# Patient Record
Sex: Female | Born: 1960
Health system: Southern US, Community
[De-identification: ages and names within clinical notes are randomized; demographics above are authoritative.]

## PROBLEM LIST (undated history)

## (undated) DIAGNOSIS — G44229 Chronic tension-type headache, not intractable: Secondary | ICD-10-CM

## (undated) DIAGNOSIS — F410 Panic disorder [episodic paroxysmal anxiety] without agoraphobia: Secondary | ICD-10-CM

## (undated) DIAGNOSIS — K219 Gastro-esophageal reflux disease without esophagitis: Secondary | ICD-10-CM

## (undated) DIAGNOSIS — D126 Benign neoplasm of colon, unspecified: Secondary | ICD-10-CM

## (undated) DIAGNOSIS — D219 Benign neoplasm of connective and other soft tissue, unspecified: Secondary | ICD-10-CM

## (undated) DIAGNOSIS — IMO0001 Reserved for inherently not codable concepts without codable children: Secondary | ICD-10-CM

## (undated) HISTORY — DX: Reserved for inherently not codable concepts without codable children: IMO0001

## (undated) HISTORY — DX: Chronic tension-type headache, not intractable: G44.229

## (undated) HISTORY — PX: TONSILLECTOMY: SUR1361

## (undated) HISTORY — PX: MYOMECTOMY: SHX85

## (undated) HISTORY — DX: Benign neoplasm of colon, unspecified: D12.6

## (undated) HISTORY — DX: Benign neoplasm of connective and other soft tissue, unspecified: D21.9

## (undated) HISTORY — DX: Panic disorder (episodic paroxysmal anxiety): F41.0

---

## 1998-05-24 ENCOUNTER — Ambulatory Visit (HOSPITAL_COMMUNITY): Admission: RE | Admit: 1998-05-24 | Discharge: 1998-05-24 | Payer: Self-pay | Admitting: *Deleted

## 1999-02-17 ENCOUNTER — Other Ambulatory Visit: Admission: RE | Admit: 1999-02-17 | Discharge: 1999-02-17 | Payer: Self-pay | Admitting: *Deleted

## 2000-08-13 ENCOUNTER — Other Ambulatory Visit: Admission: RE | Admit: 2000-08-13 | Discharge: 2000-08-13 | Payer: Self-pay | Admitting: *Deleted

## 2000-09-18 ENCOUNTER — Encounter: Payer: Self-pay | Admitting: *Deleted

## 2000-09-18 ENCOUNTER — Ambulatory Visit (HOSPITAL_COMMUNITY): Admission: RE | Admit: 2000-09-18 | Discharge: 2000-09-18 | Payer: Self-pay | Admitting: *Deleted

## 2001-11-29 ENCOUNTER — Other Ambulatory Visit: Admission: RE | Admit: 2001-11-29 | Discharge: 2001-11-29 | Payer: Self-pay | Admitting: *Deleted

## 2002-11-18 ENCOUNTER — Encounter: Payer: Self-pay | Admitting: Obstetrics and Gynecology

## 2002-11-18 ENCOUNTER — Ambulatory Visit (HOSPITAL_COMMUNITY): Admission: RE | Admit: 2002-11-18 | Discharge: 2002-11-18 | Payer: Self-pay | Admitting: Obstetrics and Gynecology

## 2002-12-02 ENCOUNTER — Other Ambulatory Visit: Admission: RE | Admit: 2002-12-02 | Discharge: 2002-12-02 | Payer: Self-pay | Admitting: Obstetrics and Gynecology

## 2004-04-29 ENCOUNTER — Ambulatory Visit (HOSPITAL_COMMUNITY): Admission: RE | Admit: 2004-04-29 | Discharge: 2004-04-29 | Payer: Self-pay | Admitting: Family Medicine

## 2004-05-24 ENCOUNTER — Other Ambulatory Visit: Admission: RE | Admit: 2004-05-24 | Discharge: 2004-05-24 | Payer: Self-pay | Admitting: Family Medicine

## 2005-12-05 ENCOUNTER — Ambulatory Visit (HOSPITAL_COMMUNITY): Admission: RE | Admit: 2005-12-05 | Discharge: 2005-12-05 | Payer: Self-pay | Admitting: Obstetrics and Gynecology

## 2007-10-03 ENCOUNTER — Ambulatory Visit: Payer: Self-pay | Admitting: Family Medicine

## 2008-10-12 ENCOUNTER — Ambulatory Visit: Payer: Self-pay | Admitting: Family Medicine

## 2008-10-28 ENCOUNTER — Ambulatory Visit (HOSPITAL_COMMUNITY): Admission: RE | Admit: 2008-10-28 | Discharge: 2008-10-28 | Payer: Self-pay | Admitting: Obstetrics and Gynecology

## 2008-11-10 ENCOUNTER — Ambulatory Visit: Payer: Self-pay | Admitting: Family Medicine

## 2008-11-11 ENCOUNTER — Ambulatory Visit: Payer: Self-pay | Admitting: Family Medicine

## 2008-12-22 ENCOUNTER — Ambulatory Visit: Payer: Self-pay | Admitting: Family Medicine

## 2009-09-27 ENCOUNTER — Ambulatory Visit: Payer: Self-pay | Admitting: Family Medicine

## 2009-10-22 ENCOUNTER — Ambulatory Visit: Payer: Self-pay | Admitting: Family Medicine

## 2009-10-29 ENCOUNTER — Ambulatory Visit: Payer: Self-pay | Admitting: Family Medicine

## 2009-11-22 ENCOUNTER — Ambulatory Visit (HOSPITAL_COMMUNITY): Admission: RE | Admit: 2009-11-22 | Discharge: 2009-11-22 | Payer: Self-pay | Admitting: Family Medicine

## 2009-12-17 ENCOUNTER — Ambulatory Visit (HOSPITAL_COMMUNITY): Admission: RE | Admit: 2009-12-17 | Discharge: 2009-12-17 | Payer: Self-pay | Admitting: Gastroenterology

## 2010-09-20 ENCOUNTER — Ambulatory Visit: Payer: Self-pay | Admitting: Family Medicine

## 2010-09-29 ENCOUNTER — Ambulatory Visit: Payer: Self-pay | Admitting: Physician Assistant

## 2011-05-15 ENCOUNTER — Encounter: Payer: Self-pay | Admitting: Family Medicine

## 2011-09-25 ENCOUNTER — Encounter: Payer: Self-pay | Admitting: Medical

## 2011-09-25 ENCOUNTER — Ambulatory Visit (INDEPENDENT_AMBULATORY_CARE_PROVIDER_SITE_OTHER): Payer: Managed Care, Other (non HMO) | Admitting: Medical

## 2011-09-25 VITALS — BP 116/80 | HR 80 | Temp 98.1°F | Resp 16 | Wt 164.0 lb

## 2011-09-25 DIAGNOSIS — E559 Vitamin D deficiency, unspecified: Secondary | ICD-10-CM | POA: Insufficient documentation

## 2011-09-25 DIAGNOSIS — R252 Cramp and spasm: Secondary | ICD-10-CM | POA: Insufficient documentation

## 2011-09-25 DIAGNOSIS — R609 Edema, unspecified: Secondary | ICD-10-CM

## 2011-09-25 LAB — POCT URINALYSIS DIPSTICK
Glucose, UA: NEGATIVE
Leukocytes, UA: NEGATIVE
Nitrite, UA: NEGATIVE
Protein, UA: NEGATIVE
Urobilinogen, UA: NEGATIVE

## 2011-09-25 NOTE — Patient Instructions (Signed)
Edema Edema is an abnormal build-up of fluids in tissues. Because this is partly dependent on gravity (water flows to the lowest place), it is more common in the leg sand thighs (lower extremities). It is also common in the looser tissues, like around the eyes. Painless swelling of the feet and ankles is common and increases as a person ages. It may affect both legs and may include the calves or even thighs. When squeezed, the fluid may move out of the affected area and may leave a dent for a few moments. CAUSES   Prolonged standing or sitting in one place for extended periods of time. Movement helps pump tissue fluid into the veins, and absence of movement prevents this, resulting in edema.   Varicose veins. The valves in the veins do not work as well as they should. This causes fluid to leak into the tissues.   Fluid and salt overload.   Injury, burn, or surgery to the leg, ankle, or foot, may damage veins and allow fluid to leak out.   Sunburn damages vessels. Leaky vessels allow fluid to go out into the sunburned tissues.   Allergies (from insect bites or stings, medications or chemicals) cause swelling by allowing vessels to become leaky.   Protein in the blood helps keep fluid in your vessels. Low protein, as in malnutrition, allows fluid to leak out.   Hormonal changes, including pregnancy and menstruation, cause fluid retention. This fluid may leak out of vessels and cause edema.   Medications that cause fluid retention. Examples are sex hormones, blood pressure medications, steroid treatment, or anti-depressants.   Some illnesses cause edema, especially heart failure, kidney disease, or liver disease.   Surgery that cuts veins or lymph nodes, such as surgery done for the heart or for breast cancer, may result in edema.  DIAGNOSIS  Your caregiver is usually easily able to determine what is causing your swelling (edema) by simply asking what is wrong (getting a history) and examining  you (doing a physical). Sometimes x-rays, EKG (electrocardiogram or heart tracing), and blood work may be done to evaluate for underlying medical illness. TREATMENT  General treatment includes:  Leg elevation (or elevation of the affected body part).   Restriction of fluid intake.   Prevention of fluid overload.   Compression of the affected body part. Compression with elastic bandages or support stockings squeezes the tissues, preventing fluid from entering and forcing it back into the blood vessels.   Diuretics (also called water pills or fluid pills) pull fluid out of your body in the form of increased urination. These are effective in reducing the swelling, but can have side effects and must be used only under your caregiver's supervision. Diuretics are appropriate only for some types of edema.  The specific treatment can be directed at any underlying causes discovered. Heart, liver, or kidney disease should be treated appropriately. HOME CARE INSTRUCTIONS   Elevate the legs (or affected body part) above the level of the heart, while lying down.   Avoid sitting or standing still for prolonged periods of time.   Avoid putting anything directly under the knees when lying down, and do not wear constricting clothing or garters on the upper legs.   Exercising the legs causes the fluid to work back into the veins and lymphatic channels. This may help the swelling go down.   The pressure applied by elastic bandages or support stockings can help reduce ankle swelling.   A low-salt diet may help reduce fluid   retention and decrease the ankle swelling.   Take any medications exactly as prescribed.  SEEK MEDICAL CARE IF:  Your edema is not responding to recommended treatments. SEEK IMMEDIATE MEDICAL CARE IF:   You develop shortness of breath or chest pain.   You cannot breathe when you lay down; or if, while lying down, you have to get up and go to the window to get your breath.   You  are having increasing swelling without relief from treatment.   You develop a fever over 102 F (38.9 C).   You develop pain or redness in the areas that are swollen.   Tell your caregiver right away if you have gained 3 lb/1.4 kg in 1 day or 5 lb/2.3 kg in a week.  MAKE SURE YOU:   Understand these instructions.   Will watch your condition.   Will get help right away if you are not doing well or get worse.  Document Released: 10/02/2005 Document Revised: 06/14/2011 Document Reviewed: 05/20/2008 [employer] Patient Information 2012 [employer]. 

## 2011-09-25 NOTE — Progress Notes (Signed)
Subjective:   HPI  Marie Vazquez is a 50 y.o. female who presents for bilat ankle swelling x 1wk, worse on the left.  She denies any new changes in diet, lifestyle, exercise.  Not been on feet more than usual, hasn't eaten more salt, has actually cut back on salt a while ago.  She though maybe she sprained her ankles as they felt a little sore.   Swelling is improved in the morning after being supine all night.  Denies hx/o injury or trauma.  Denies CP, no SOB or dyspnea.   Denies hx/o CHF or renal disease.  She works at a sedentary job in the office, walks some, walks stairs 3 times daily , 88 stairs.  No other aggravating or relieving factors.    No other c/o.  The following portions of the patient's history were reviewed and updated as appropriate: allergies, current medications, past family history, past medical history, past social history, past surgical history and problem list.  Past Medical History  Diagnosis Date  . Chronic tension headaches   . Panic attacks   . Fibroids   . Contraception   . Vitamin D deficiency    Review of Systems Constitutional: -fever, -chills, -sweats, -unexpected -weight change,-fatigue ENT: -runny nose, -ear pain, -sore throat Cardiology:  -chest pain, -palpitations, +edema Respiratory: -cough, -shortness of breath, -wheezing Gastroenterology: -abdominal pain, -nausea, -vomiting, -diarrhea, -constipation Hematology: -bleeding or bruising problems Musculoskeletal: -arthralgias, -myalgias, -joint swelling, -back pain Ophthalmology: -vision changes Urology: -dysuria, -difficulty urinating, -hematuria, -urinary frequency, -urgency Neurology: +headache, -weakness, -tingling, -numbness     Objective:   Physical Exam  Filed Vitals:   09/25/11 1354  BP: 116/80  Pulse: 80  Temp: 98.1 F (36.7 C)  Resp: 16    General appearance: alert, no distress, WD/WN Oral cavity: MMM, no lesions Neck: supple, no lymphadenopathy, no thyromegaly, no masses Heart:  RRR, normal S1, S2, no murmurs Lungs: CTA bilaterally, no wheezes, rhonchi, or rales Abdomen: +bs, soft, non tender, non distended, no masses, no hepatomegaly, no splenomegaly Pulses: 2+ symmetric, upper and lower extremities, normal cap refill   Assessment and Plan :    Encounter Diagnoses  Name Primary?  . Edema Yes  . Cramp in limb   . Vitamin D deficiency    Edema, cramp - labs today.  Discussed possible etiologies of edema.  Likely dependent edema, but discussed other etiologies.  Advised she f/u with gynecology for yearly screening and exam.  Increase exercise, c/t low salt diet, can consider OTC compression hose.  Will call with labs.   Cramps - hydrate well, pending labs, can consider Magnesium and Potassium or can consider OTC Tonic water.  Vit D deficiency prior - recheck labs, and if still, consider OTC Vit D daily indefinitely

## 2011-09-26 LAB — CBC WITH DIFFERENTIAL/PLATELET
Basophils Absolute: 0 10*3/uL (ref 0.0–0.1)
Eosinophils Relative: 2 % (ref 0–5)
HCT: 38.6 % (ref 36.0–46.0)
Hemoglobin: 12.3 g/dL (ref 12.0–15.0)
Lymphocytes Relative: 35 % (ref 12–46)
MCV: 90.6 fL (ref 78.0–100.0)
Monocytes Absolute: 0.4 10*3/uL (ref 0.1–1.0)
Monocytes Relative: 8 % (ref 3–12)
RDW: 13.9 % (ref 11.5–15.5)
WBC: 4.7 10*3/uL (ref 4.0–10.5)

## 2011-09-26 LAB — COMPREHENSIVE METABOLIC PANEL
ALT: 13 U/L (ref 0–35)
AST: 20 U/L (ref 0–37)
Albumin: 4.3 g/dL (ref 3.5–5.2)
Alkaline Phosphatase: 70 U/L (ref 39–117)
BUN: 11 mg/dL (ref 6–23)
CO2: 24 mEq/L (ref 19–32)
Calcium: 9.1 mg/dL (ref 8.4–10.5)
Chloride: 104 mEq/L (ref 96–112)
Creat: 0.9 mg/dL (ref 0.50–1.10)
Glucose, Bld: 81 mg/dL (ref 70–99)
Potassium: 4.2 mEq/L (ref 3.5–5.3)
Sodium: 138 mEq/L (ref 135–145)
Total Bilirubin: 0.4 mg/dL (ref 0.3–1.2)
Total Protein: 7.2 g/dL (ref 6.0–8.3)

## 2011-09-26 LAB — TSH: TSH: 2 u[IU]/mL (ref 0.350–4.500)

## 2011-09-26 LAB — VITAMIN D 25 HYDROXY (VIT D DEFICIENCY, FRACTURES): Vit D, 25-Hydroxy: 14 ng/mL — ABNORMAL LOW (ref 30–89)

## 2011-10-06 ENCOUNTER — Other Ambulatory Visit (HOSPITAL_COMMUNITY): Payer: Self-pay | Admitting: Obstetrics and Gynecology

## 2011-10-06 DIAGNOSIS — Z1231 Encounter for screening mammogram for malignant neoplasm of breast: Secondary | ICD-10-CM

## 2011-11-08 ENCOUNTER — Ambulatory Visit (HOSPITAL_COMMUNITY)
Admission: RE | Admit: 2011-11-08 | Discharge: 2011-11-08 | Disposition: A | Discharge status: Home / Self Care | Payer: Managed Care, Other (non HMO) | Source: Ambulatory Visit | Attending: Obstetrics and Gynecology | Admitting: Obstetrics and Gynecology

## 2011-11-08 DIAGNOSIS — Z1231 Encounter for screening mammogram for malignant neoplasm of breast: Secondary | ICD-10-CM

## 2012-11-01 ENCOUNTER — Other Ambulatory Visit: Payer: Self-pay | Admitting: Family Medicine

## 2012-11-01 DIAGNOSIS — Z1231 Encounter for screening mammogram for malignant neoplasm of breast: Secondary | ICD-10-CM

## 2012-11-15 ENCOUNTER — Ambulatory Visit (HOSPITAL_COMMUNITY)
Admission: RE | Admit: 2012-11-15 | Discharge: 2012-11-15 | Disposition: A | Discharge status: Home / Self Care | Payer: Managed Care, Other (non HMO) | Source: Ambulatory Visit | Attending: Family Medicine | Admitting: Family Medicine

## 2012-11-15 DIAGNOSIS — Z1231 Encounter for screening mammogram for malignant neoplasm of breast: Secondary | ICD-10-CM

## 2013-03-28 ENCOUNTER — Ambulatory Visit (INDEPENDENT_AMBULATORY_CARE_PROVIDER_SITE_OTHER): Payer: Managed Care, Other (non HMO) | Admitting: Medical

## 2013-03-28 ENCOUNTER — Encounter: Payer: Self-pay | Admitting: Medical

## 2013-03-28 VITALS — BP 100/80 | HR 72 | Temp 98.2°F | Resp 16 | Wt 170.0 lb

## 2013-03-28 DIAGNOSIS — H6091 Unspecified otitis externa, right ear: Secondary | ICD-10-CM

## 2013-03-28 DIAGNOSIS — H60399 Other infective otitis externa, unspecified ear: Secondary | ICD-10-CM

## 2013-03-28 MED ORDER — NEOMYCIN-POLYMYXIN-HC 3.5-10000-1 OT SOLN: 3.0000 [drp] | Freq: Four times a day (QID) | OTIC | Status: DC

## 2013-03-28 NOTE — Progress Notes (Signed)
Subjective:  Marie Vazquez is a 52 y.o. female who presents for evaluation of right ear pain. Symptoms have been present for 2 weeks. She also notes drainage in the right ear and a plugged sensation in the right ear. She does have a history of ear infections in remote past. She does not have a history of recent swimming.  Denies cough, runny nose, fever, sore throat.    Past Medical History  Diagnosis Date  . Chronic tension headaches   . Panic attacks   . Fibroids   . Contraception   . Vitamin D deficiency    ROS as in subjective    Objective:    BP 100/80  Pulse 72  Temp(Src) 98.2 F (36.8 C) (Oral)  Resp 16  Wt 170 lb (77.111 kg) General:  alert and cooperative  Right Ear: right TM could not see and right canal inflamed and with purulent discharge  Left Ear: normal appearance  Mouth:  lips, mucosa, and tongue normal; teeth and gums normal  Neck: no adenopathy, supple, symmetrical, trachea midline and thyroid not enlarged, symmetric, no tenderness/mass/nodules        Assessment and Plan:   Encounter Diagnosis  Name Primary?  . Otitis externa, right Yes   Discussed diagnosis, symptoms, risk factors, treatment.  Begin cortisporin otic.  If not improving in 3-4 days, then return.  Return soon for physical.

## 2014-06-09 ENCOUNTER — Other Ambulatory Visit (HOSPITAL_COMMUNITY): Payer: Self-pay | Admitting: Obstetrics and Gynecology

## 2014-06-09 DIAGNOSIS — Z1231 Encounter for screening mammogram for malignant neoplasm of breast: Secondary | ICD-10-CM

## 2014-06-29 ENCOUNTER — Ambulatory Visit (HOSPITAL_COMMUNITY)
Admission: RE | Admit: 2014-06-29 | Discharge: 2014-06-29 | Disposition: A | Discharge status: Home / Self Care | Payer: BC Managed Care – PPO | Source: Ambulatory Visit | Attending: Obstetrics and Gynecology | Admitting: Obstetrics and Gynecology

## 2014-06-29 ENCOUNTER — Other Ambulatory Visit (HOSPITAL_COMMUNITY): Payer: Self-pay | Admitting: Obstetrics and Gynecology

## 2014-06-29 DIAGNOSIS — Z1231 Encounter for screening mammogram for malignant neoplasm of breast: Secondary | ICD-10-CM | POA: Diagnosis present

## 2015-11-19 ENCOUNTER — Encounter: Payer: Self-pay | Admitting: Family Medicine

## 2015-11-19 ENCOUNTER — Ambulatory Visit (INDEPENDENT_AMBULATORY_CARE_PROVIDER_SITE_OTHER): Payer: BLUE CROSS/BLUE SHIELD | Admitting: Family Medicine

## 2015-11-19 VITALS — BP 102/68 | HR 65 | Wt 173.0 lb

## 2015-11-19 DIAGNOSIS — R21 Rash and other nonspecific skin eruption: Secondary | ICD-10-CM

## 2015-11-19 LAB — CBC WITH DIFFERENTIAL/PLATELET
Basophils Absolute: 0 10*3/uL (ref 0.0–0.1)
Basophils Relative: 0 % (ref 0–1)
EOS PCT: 2 % (ref 0–5)
Eosinophils Absolute: 0.1 10*3/uL (ref 0.0–0.7)
HCT: 39.7 % (ref 36.0–46.0)
HEMOGLOBIN: 12.7 g/dL (ref 12.0–15.0)
LYMPHS PCT: 28 % (ref 12–46)
Lymphs Abs: 1.6 10*3/uL (ref 0.7–4.0)
MCH: 27.7 pg (ref 26.0–34.0)
MCHC: 32 g/dL (ref 30.0–36.0)
MCV: 86.5 fL (ref 78.0–100.0)
MPV: 11 fL (ref 8.6–12.4)
Monocytes Absolute: 0.4 10*3/uL (ref 0.1–1.0)
Monocytes Relative: 8 % (ref 3–12)
Neutro Abs: 3.5 10*3/uL (ref 1.7–7.7)
Neutrophils Relative %: 62 % (ref 43–77)
Platelets: 216 10*3/uL (ref 150–400)
RBC: 4.59 MIL/uL (ref 3.87–5.11)
RDW: 13.9 % (ref 11.5–15.5)
WBC: 5.6 10*3/uL (ref 4.0–10.5)

## 2015-11-19 LAB — COMPREHENSIVE METABOLIC PANEL
ALBUMIN: 4.3 g/dL (ref 3.6–5.1)
ALT: 10 U/L (ref 6–29)
AST: 16 U/L (ref 10–35)
Alkaline Phosphatase: 73 U/L (ref 33–130)
BUN: 16 mg/dL (ref 7–25)
CALCIUM: 9.5 mg/dL (ref 8.6–10.4)
CO2: 24 mmol/L (ref 20–31)
Chloride: 101 mmol/L (ref 98–110)
Creat: 0.92 mg/dL (ref 0.50–1.05)
Glucose, Bld: 88 mg/dL (ref 65–99)
POTASSIUM: 4 mmol/L (ref 3.5–5.3)
Sodium: 140 mmol/L (ref 135–146)
TOTAL PROTEIN: 7.7 g/dL (ref 6.1–8.1)
Total Bilirubin: 0.4 mg/dL (ref 0.2–1.2)

## 2015-11-19 NOTE — Patient Instructions (Signed)
Use Benadryl daily. Cold wash rag to finger will also help. The swelling in her finger gets worse she'll have to go to the ER

## 2015-11-19 NOTE — Progress Notes (Signed)
   Subjective:    Patient ID: Marie Vazquez, female    DOB: 1960-12-14, 55 y.o.   MRN: [id number]  HPI She noted a one-day history of swelling to the PIP joint area of the left ring finger. Apparently the swelling today has diminished precipitously since this morning. She then noted some left upper chest swelling and itching as well as a lesion on the posterior neck on the left side. She's had no new soaps, detergents, medications. She does state the prior to this she did have several areas that resemble bug bites but when away fairly quickly.   Review of Systems     Objective:   Physical Exam Alert and in no distress. Erythematous raised relatively flat lesions are noted on the left chest and on the posterior neck area. The left ring finger is swollen especially over the PIP joint area but does not feel that is in the joint. Her rings seem to be moving fairly nicely.No swelling noted in the wrist elbows or knees. No other joints were palpably swollen in her hands or fingers.       Assessment & Plan:  Rash and nonspecific skin eruption - Plan: CBC with Differential/Platelet, Comprehensive metabolic panel, Sedimentation rate Recommend cool compresses and if there is any question about worsening swelling, she should go to the emergency room for removal of the rings. I will do routine blood screening on her. The rash and swelling make me concerned about some collagen vascular process.

## 2015-11-20 LAB — SEDIMENTATION RATE: SED RATE: 17 mm/h (ref 0–30)

## 2015-11-22 ENCOUNTER — Telehealth: Payer: Self-pay | Admitting: *Deleted

## 2015-11-22 NOTE — Telephone Encounter (Signed)
Made appointment [facility] dermatology 2/9 Thursday @ 8:30 English Black P.A.

## 2015-12-07 ENCOUNTER — Encounter: Payer: Self-pay | Admitting: Family Medicine

## 2015-12-07 ENCOUNTER — Ambulatory Visit (INDEPENDENT_AMBULATORY_CARE_PROVIDER_SITE_OTHER): Payer: BLUE CROSS/BLUE SHIELD | Admitting: Family Medicine

## 2015-12-07 VITALS — BP 120/82 | HR 105 | Ht 65.0 in | Wt 169.4 lb

## 2015-12-07 DIAGNOSIS — R21 Rash and other nonspecific skin eruption: Secondary | ICD-10-CM | POA: Diagnosis not present

## 2015-12-07 NOTE — Progress Notes (Signed)
   Subjective:    Patient ID: Marie Vazquez, female    DOB: 04-23-1961, 55 y.o.   MRN: [id number]  HPI She is here for evaluation of itching, erythema and swelling. She has 3 separate lesions on her at the present time. She noted all 3 of these roughly around the same time with itching and discomfort. This is a third episode of this. She does not remember any insect bites. She has had no soaps, detergents, medications. She did note yesterday some swelling of her right hand but not in the MCP or DIP joint area. She is not noticing swelling in her wrists, elbows knees or ankles.   Review of Systems     Objective:   Physical Exam  Alert and in no distress. She has 3 separate areas that show erythema with a central small whitish type vesicle. One is on her right forearm, left upper chest area and also wrist Sprays are normal      Assessment & Plan:  Rash and nonspecific skin eruption I am concerned that this could possibly be angioedema and will therefore refer to rheumatology. This is a third time that this has occurred. Previous blood work was negative and I doubt this is truly an insect bite although it certainly has some characteristics of that.

## 2016-01-03 ENCOUNTER — Ambulatory Visit (INDEPENDENT_AMBULATORY_CARE_PROVIDER_SITE_OTHER): Payer: BLUE CROSS/BLUE SHIELD | Admitting: Family Medicine

## 2016-01-03 ENCOUNTER — Encounter: Payer: Self-pay | Admitting: Family Medicine

## 2016-01-03 VITALS — BP 110/70 | HR 68 | Temp 98.2°F | Ht 65.25 in | Wt 167.6 lb

## 2016-01-03 DIAGNOSIS — R21 Rash and other nonspecific skin eruption: Secondary | ICD-10-CM | POA: Diagnosis not present

## 2016-01-03 NOTE — Progress Notes (Signed)
   Subjective:    Patient ID: Marie Vazquez, female    DOB: April 27, 1961, 55 y.o.   MRN: [id number]  HPI She is again complaining of itching and rash on her arms and legs. She was seen by Dr. Charlestine Night. His note is not immediately available but apparently he did not find anything significant.   Review of Systems     Objective:   Physical Exam Alert and in no distress. Exam of her skin shows no obvious lesions.       Assessment & Plan:  Rash and nonspecific skin eruption - Plan: Lupus anticoagulant panel I think we need to look for lupus in her as this does seem to be more of an issue. This was discussed with Dr. Charlestine Night

## 2016-01-04 ENCOUNTER — Encounter: Payer: Self-pay | Admitting: Family Medicine

## 2016-01-04 LAB — LUPUS(12) PANEL
ANA: POSITIVE — AB
C3 Complement: 131 mg/dL (ref 90–180)
C4 COMPLEMENT: 35 mg/dL (ref 10–40)
ENA SM AB SER-ACNC: NEGATIVE
Rhuematoid fact SerPl-aCnc: <10 IU/mL (ref <=14)
Ribosomal P Protein Ab: <1
SM/RNP: NEGATIVE
SSA (Ro) (ENA) Antibody, IgG: <1
SSB (La) (ENA) Antibody, IgG: <1
Scleroderma (Scl-70) (ENA) Antibody, IgG: <1
Thyroperoxidase Ab SerPl-aCnc: <1 IU/mL (ref <9)
ds DNA Ab: <1 IU/mL

## 2016-01-04 LAB — ANTI-NUCLEAR AB-TITER (ANA TITER)

## 2016-01-21 ENCOUNTER — Encounter: Payer: Self-pay | Admitting: Family Medicine

## 2016-01-21 ENCOUNTER — Ambulatory Visit (INDEPENDENT_AMBULATORY_CARE_PROVIDER_SITE_OTHER): Payer: BLUE CROSS/BLUE SHIELD | Admitting: Family Medicine

## 2016-01-21 VITALS — BP 110/80 | HR 73 | Ht 65.0 in | Wt 169.0 lb

## 2016-01-21 DIAGNOSIS — M329 Systemic lupus erythematosus, unspecified: Secondary | ICD-10-CM

## 2016-01-21 MED ORDER — HYDROXYZINE HCL 25 MG PO TABS: 25.0000 mg | ORAL_TABLET | Freq: Three times a day (TID) | ORAL | Status: DC | PRN

## 2016-01-21 NOTE — Progress Notes (Signed)
   Subjective:    Patient ID: Marie Vazquez, female    DOB: 09-04-1961, 55 y.o.   MRN: [id number]  HPI She is here for consultation concerning continued difficulty with rash and itching. Her most recent blood work did show evidence of lupus. This information was sent to Dr. Charlestine Night.He is supposed to call back but have not received a call back.   Review of Systems     Objective:   Physical Exam No distress otherwise not examined       Assessment & Plan:  Lupus ([place]) - Plan: hydrOXYzine (ATARAX/VISTARIL) 25 MG tablet We'll give her Atarax. Information was again faxed to his office and I will follow up on Monday to get her an appointment to see him.

## 2016-01-26 ENCOUNTER — Telehealth: Payer: Self-pay | Admitting: Family Medicine

## 2016-01-26 NOTE — Telephone Encounter (Signed)
Called Dr. Charlestine Night 336 [phone] their office is closed from Tues to [place] for Easter.  Dr. Redmond School wants them to see her again as her new blood work shows Lupus.  He wants her re-evaluated for Lupus.

## 2016-01-31 DIAGNOSIS — R799 Abnormal finding of blood chemistry, unspecified: Secondary | ICD-10-CM | POA: Diagnosis not present

## 2016-01-31 DIAGNOSIS — D309 Benign neoplasm of urinary organ, unspecified: Secondary | ICD-10-CM | POA: Diagnosis not present

## 2016-01-31 DIAGNOSIS — M2550 Pain in unspecified joint: Secondary | ICD-10-CM | POA: Diagnosis not present

## 2016-01-31 NOTE — Telephone Encounter (Signed)
Marie Vazquez faxed a new referral back to [place] office

## 2016-02-08 ENCOUNTER — Other Ambulatory Visit: Payer: Self-pay

## 2016-02-08 DIAGNOSIS — Z1231 Encounter for screening mammogram for malignant neoplasm of breast: Secondary | ICD-10-CM

## 2016-02-18 DIAGNOSIS — Z975 Presence of (intrauterine) contraceptive device: Secondary | ICD-10-CM | POA: Diagnosis not present

## 2016-02-18 DIAGNOSIS — Z01419 Encounter for gynecological examination (general) (routine) without abnormal findings: Secondary | ICD-10-CM | POA: Diagnosis not present

## 2016-02-18 DIAGNOSIS — Z1151 Encounter for screening for human papillomavirus (HPV): Secondary | ICD-10-CM | POA: Diagnosis not present

## 2016-02-18 DIAGNOSIS — Z13220 Encounter for screening for lipoid disorders: Secondary | ICD-10-CM | POA: Diagnosis not present

## 2016-02-24 ENCOUNTER — Ambulatory Visit
Admission: RE | Admit: 2016-02-24 | Discharge: 2016-02-24 | Disposition: A | Discharge status: Home / Self Care | Payer: BLUE CROSS/BLUE SHIELD | Source: Ambulatory Visit

## 2016-02-24 DIAGNOSIS — Z1231 Encounter for screening mammogram for malignant neoplasm of breast: Secondary | ICD-10-CM

## 2017-02-24 DIAGNOSIS — R21 Rash and other nonspecific skin eruption: Secondary | ICD-10-CM | POA: Diagnosis not present

## 2017-05-16 ENCOUNTER — Encounter: Payer: Self-pay | Admitting: Family Medicine

## 2017-05-16 ENCOUNTER — Ambulatory Visit (INDEPENDENT_AMBULATORY_CARE_PROVIDER_SITE_OTHER): Payer: BLUE CROSS/BLUE SHIELD | Admitting: Family Medicine

## 2017-05-16 VITALS — BP 100/70 | HR 76 | Resp 18 | Wt 178.8 lb

## 2017-05-16 DIAGNOSIS — K921 Melena: Secondary | ICD-10-CM

## 2017-05-16 DIAGNOSIS — Z86010 Personal history of colonic polyps: Secondary | ICD-10-CM

## 2017-05-16 LAB — HEMOCCULT GUIAC POC 1CARD (OFFICE)

## 2017-05-16 LAB — CBC WITH DIFFERENTIAL/PLATELET
BASOS PCT: 0 %
Basophils Absolute: 0 cells/uL (ref 0–200)
Eosinophils Absolute: 159 cells/uL (ref 15–500)
Eosinophils Relative: 3 %
HEMATOCRIT: 38 % (ref 35.0–45.0)
HEMOGLOBIN: 12.4 g/dL (ref 11.7–15.5)
LYMPHS ABS: 1802 {cells}/uL (ref 850–3900)
Lymphocytes Relative: 34 %
MCH: 28.8 pg (ref 27.0–33.0)
MCHC: 32.6 g/dL (ref 32.0–36.0)
MCV: 88.2 fL (ref 80.0–100.0)
MONO ABS: 424 {cells}/uL (ref 200–950)
MPV: 10.6 fL (ref 7.5–12.5)
Monocytes Relative: 8 %
Neutro Abs: 2915 cells/uL (ref 1500–7800)
Neutrophils Relative %: 55 %
Platelets: 190 10*3/uL (ref 140–400)
RBC: 4.31 MIL/uL (ref 3.80–5.10)
RDW: 14.6 % (ref 11.0–15.0)
WBC: 5.3 10*3/uL (ref 4.0–10.5)

## 2017-05-16 NOTE — Progress Notes (Signed)
   Subjective:    Patient ID: Marie Vazquez, female    DOB: Mar 27, 1961, 56 y.o.   MRN: [id number]  HPI She is here for evaluation of a two-week history of bright red blood per rectum. She states that her stools are soft, she is having some slight rectal discomfort. No abdominal pain, nausea or vomiting. She does have a history of adenomatous polyps and was supposed to schedule for a follow-up colonoscopy in 2015 which she has not done.   Review of Systems     Objective:   Physical Exam Alert and in no distress. Anal exam shows no external hemorrhoids or fissures. Rectal exam showed no palpable lesions. Stool is guaiac-positive.       Assessment & Plan:  Blood in stool - Plan: CBC with Differential/Platelet, Comprehensive metabolic panel, Ambulatory referral to Gastroenterology, POCT occult blood stool, CANCELED: Ambulatory referral to Gastroenterology  Personal history of colonic polyps - Plan: CBC with Differential/Platelet, Comprehensive metabolic panel, Ambulatory referral to Gastroenterology, POCT occult blood stool Anoscopy not done and I will refer to GI since she is 3 years behind schedule. If she cannot get in for well, I will probably give her Anusol HC as she could be having internal hemorrhoids

## 2017-05-17 LAB — COMPREHENSIVE METABOLIC PANEL
ALBUMIN: 4.2 g/dL (ref 3.6–5.1)
ALK PHOS: 83 U/L (ref 33–130)
ALT: 15 U/L (ref 6–29)
AST: 17 U/L (ref 10–35)
BUN: 14 mg/dL (ref 7–25)
CALCIUM: 9.2 mg/dL (ref 8.6–10.4)
CO2: 22 mmol/L (ref 20–31)
Chloride: 107 mmol/L (ref 98–110)
Creat: 0.96 mg/dL (ref 0.50–1.05)
GLUCOSE: 82 mg/dL (ref 65–99)
Potassium: 3.9 mmol/L (ref 3.5–5.3)
Sodium: 141 mmol/L (ref 135–146)
Total Bilirubin: 0.3 mg/dL (ref 0.2–1.2)
Total Protein: 7 g/dL (ref 6.1–8.1)

## 2017-05-22 DIAGNOSIS — Z01419 Encounter for gynecological examination (general) (routine) without abnormal findings: Secondary | ICD-10-CM | POA: Diagnosis not present

## 2017-05-22 DIAGNOSIS — N951 Menopausal and female climacteric states: Secondary | ICD-10-CM | POA: Diagnosis not present

## 2017-05-22 DIAGNOSIS — Z1231 Encounter for screening mammogram for malignant neoplasm of breast: Secondary | ICD-10-CM | POA: Diagnosis not present

## 2017-05-22 DIAGNOSIS — Z6829 Body mass index (BMI) 29.0-29.9, adult: Secondary | ICD-10-CM | POA: Diagnosis not present

## 2017-06-01 DIAGNOSIS — K921 Melena: Secondary | ICD-10-CM | POA: Diagnosis not present

## 2017-06-01 DIAGNOSIS — Z86010 Personal history of colonic polyps: Secondary | ICD-10-CM | POA: Diagnosis not present

## 2017-06-19 DIAGNOSIS — Z30432 Encounter for removal of intrauterine contraceptive device: Secondary | ICD-10-CM | POA: Diagnosis not present

## 2017-06-19 DIAGNOSIS — Z309 Encounter for contraceptive management, unspecified: Secondary | ICD-10-CM | POA: Diagnosis not present

## 2017-06-22 DIAGNOSIS — Z86010 Personal history of colonic polyps: Secondary | ICD-10-CM | POA: Diagnosis not present

## 2017-06-22 DIAGNOSIS — D126 Benign neoplasm of colon, unspecified: Secondary | ICD-10-CM

## 2017-06-22 HISTORY — DX: Benign neoplasm of colon, unspecified: D12.6

## 2017-06-22 LAB — HM COLONOSCOPY

## 2017-06-27 DIAGNOSIS — D126 Benign neoplasm of colon, unspecified: Secondary | ICD-10-CM | POA: Diagnosis not present

## 2017-07-03 ENCOUNTER — Encounter: Payer: Self-pay | Admitting: Family Medicine

## 2017-07-04 DIAGNOSIS — D122 Benign neoplasm of ascending colon: Secondary | ICD-10-CM | POA: Diagnosis not present

## 2017-07-06 ENCOUNTER — Encounter: Payer: Self-pay | Admitting: Family Medicine

## 2017-07-25 ENCOUNTER — Other Ambulatory Visit: Payer: Self-pay | Admitting: General Surgery

## 2017-07-25 ENCOUNTER — Ambulatory Visit: Payer: Self-pay | Admitting: General Surgery

## 2017-07-25 DIAGNOSIS — Z86010 Personal history of colonic polyps: Secondary | ICD-10-CM

## 2017-07-25 DIAGNOSIS — D122 Benign neoplasm of ascending colon: Secondary | ICD-10-CM | POA: Diagnosis not present

## 2017-08-07 ENCOUNTER — Ambulatory Visit
Admission: RE | Admit: 2017-08-07 | Discharge: 2017-08-07 | Disposition: A | Discharge status: Home / Self Care | Payer: BLUE CROSS/BLUE SHIELD | Source: Ambulatory Visit | Attending: General Surgery | Admitting: General Surgery

## 2017-08-07 DIAGNOSIS — Z86010 Personal history of colonic polyps: Secondary | ICD-10-CM

## 2017-08-07 DIAGNOSIS — K7689 Other specified diseases of liver: Secondary | ICD-10-CM | POA: Diagnosis not present

## 2017-08-07 MED ORDER — IOPAMIDOL (ISOVUE-300) INJECTION 61%
100.0000 mL | Freq: Once | INTRAVENOUS | Status: AC | PRN
  Administered 2017-08-07: 100 mL via INTRAVENOUS

## 2017-08-10 ENCOUNTER — Encounter (HOSPITAL_COMMUNITY): Payer: Self-pay

## 2017-08-10 NOTE — Pre-Procedure Instructions (Signed)
Marie Vazquez  08/10/2017      [facility] - [address] AT [facility] OF [address] [place] [address] Phone: [phone] Fax: [phone]    Your procedure is scheduled on Thursday, November 1st   Report to [facility] at [place].M.             (posted surgery time 7:30a- 9:00a)   Call this number if you have problems the MORNING of surgery:  [phone].   Remember:              4-5 days prior to surgery, STOP TAKING any Vitamins, Herbal Supplements, Anti-inflammatories.   Do not eat food or drink liquids after midnight Wednesday.              There is an exception to this rule -  Please compete your 8 oz Boost Breeze by 3:30AM  Continue all other medications as directed by your physician except follow these medication instructions before surgery   Take these medicines the morning of surgery with A SIP OF WATER- hydroxyzine as needed.   Do not wear jewelry, make-up or nail polish.  Do not wear lotions, powders,  perfumes, or deoderant.  Do not shave 48 hours prior to surgery.    Do not bring valuables to the hospital.  [facility] is not responsible for any belongings or valuables.  Contacts, dentures or bridgework may not be worn into surgery.  Leave your suitcase in the car.  After surgery it may be brought to your room. For patients admitted to the hospital, discharge time will be determined by your treatment team.    [facility]- Preparing For Surgery  Before surgery, you can play an important role. Because skin is not sterile, your skin needs to be as free of germs as possible. You can reduce the number of germs on your skin by washing with CHG (chlorahexidine gluconate) Soap before surgery.  CHG is an antiseptic cleaner which kills germs and bonds with the skin to continue killing germs even after washing.  Please do not use if you have an allergy to CHG or antibacterial  soaps. If your skin becomes reddened/irritated stop using the CHG.  Do not shave (including legs and underarms) for at least 48 hours prior to first CHG shower. It is OK to shave your face.  Please follow these instructions carefully.   1. Shower the NIGHT BEFORE SURGERY and the MORNING OF SURGERY with CHG.   2. If you chose to wash your hair, wash your hair first as usual with your normal shampoo.  3. After you shampoo, rinse your hair and body thoroughly to remove the shampoo.  4. Use CHG as you would any other liquid soap. You can apply CHG directly to the skin and wash gently with a scrungie or a clean washcloth.   5. Apply the CHG Soap to your body ONLY FROM THE NECK DOWN.  Do not use on open wounds or open sores. Avoid contact with your eyes, ears, mouth and genitals (private parts). Wash Face and genitals (private parts)  with your normal soap.  6. Wash thoroughly, paying special attention to the area where your surgery will be performed.  7. Thoroughly rinse your body with warm water from the neck down.  8. DO NOT shower/wash with your normal soap after using and rinsing off the CHG Soap.  9. Pat yourself dry  with a CLEAN TOWEL.  10. Wear CLEAN PAJAMAS to bed the night before surgery, wear comfortable clothes the morning of surgery  11. Place CLEAN SHEETS on your bed the night of your first shower and DO NOT SLEEP WITH PETS.    Day of Surgery: Do not apply any deodorants/lotions. Please wear clean clothes to the hospital/surgery center.     Please complete your 8oz of Boost Breeze or Water that was given to you at your preadmission appointment by 0330 on the [place]

## 2017-08-10 NOTE — Pre-Procedure Instructions (Signed)
Marie Vazquez  08/10/2017      [facility] - [address] AT [facility] OF [place] [address] Phone: [phone] Fax: [phone]    Your procedure is scheduled on 08/16/2017.  Report to [facility] at [place].M.  Call this number if you have problems the morning of surgery:  [phone]   Remember:  Do not eat food or drink liquids after midnight.  Continue all other medications as directed by your physician except follow these medication instructions before surgery   Take these medicines the morning of surgery with A SIP OF WATER- hydroxyzine as needed.    Do not wear jewelry, make-up or nail polish.  Do not wear lotions, powders, or perfumes, or deoderant.  Do not shave 48 hours prior to surgery.  Men may shave face and neck.  Do not bring valuables to the hospital.  [facility] is not responsible for any belongings or valuables.  Contacts, dentures or bridgework may not be worn into surgery.  Leave your suitcase in the car.  After surgery it may be brought to your room.  For patients admitted to the hospital, discharge time will be determined by your treatment team.  Patients discharged the day of surgery will not be allowed to drive home.    [facility]- Preparing For Surgery  Before surgery, you can play an important role. Because skin is not sterile, your skin needs to be as free of germs as possible. You can reduce the number of germs on your skin by washing with CHG (chlorahexidine gluconate) Soap before surgery.  CHG is an antiseptic cleaner which kills germs and bonds with the skin to continue killing germs even after washing.  Please do not use if you have an allergy to CHG or antibacterial soaps. If your skin becomes reddened/irritated stop using the CHG.  Do not shave (including legs and underarms) for at least 48 hours prior to first CHG shower. It is OK to shave  your face.  Please follow these instructions carefully.   1. Shower the NIGHT BEFORE SURGERY and the MORNING OF SURGERY with CHG.   2. If you chose to wash your hair, wash your hair first as usual with your normal shampoo.  3. After you shampoo, rinse your hair and body thoroughly to remove the shampoo.  4. Use CHG as you would any other liquid soap. You can apply CHG directly to the skin and wash gently with a scrungie or a clean washcloth.   5. Apply the CHG Soap to your body ONLY FROM THE NECK DOWN.  Do not use on open wounds or open sores. Avoid contact with your eyes, ears, mouth and genitals (private parts). Wash Face and genitals (private parts)  with your normal soap.  6. Wash thoroughly, paying special attention to the area where your surgery will be performed.  7. Thoroughly rinse your body with warm water from the neck down.  8. DO NOT shower/wash with your normal soap after using and rinsing off the CHG Soap.  9. Pat yourself dry with a CLEAN TOWEL.  10. Wear CLEAN PAJAMAS to bed the night before surgery, wear comfortable clothes the morning of surgery  11. Place CLEAN SHEETS on your bed the night of your first shower and DO NOT SLEEP WITH PETS.    Day of Surgery: Do not apply any deodorants/lotions. Please wear clean clothes to  the hospital/surgery center.     Please complete your 8oz of Boost Breeze or Water that was given to you at your preadmission appointment by 0330 on the [place]

## 2017-08-10 NOTE — Pre-Procedure Instructions (Signed)
Colby Catanese  08/10/2017      [facility] - [address], [address] AT [facility] OF [address] [place] [address] Phone: [phone] Fax: [phone]    Your procedure is scheduled on Thursday, November 1st   Report to [place] at [place].M.             (posted surgery time 7:30a- 9:00a)   Call this number if you have problems the MORNING of surgery:  [phone].   Remember:              4-5 days prior to surgery, STOP TAKING any Vitamins, Herbal Supplements, Anti-inflammatories.   Do not eat food or drink liquids after midnight Wednesday.              There is an exception to this rule -  Please compete your 8 oz Boost Breeze by 3:30AM  Continue all other medications as directed by your physician except follow these medication instructions before surgery   Take these medicines the morning of surgery with A SIP OF WATER- hydroxyzine as needed.   Do not wear jewelry, make-up or nail polish.  Do not wear lotions, powders,  perfumes, or deoderant.  Do not shave 48 hours prior to surgery.    Do not bring valuables to the hospital.  [facility] is not responsible for any belongings or valuables.  Contacts, dentures or bridgework may not be worn into surgery.  Leave your suitcase in the car.  After surgery it may be brought to your room. For patients admitted to the hospital, discharge time will be determined by your treatment team.    [facility] Preparing For [facility]  Before surgery, you can play an important role. Because skin is not sterile, your skin needs to be as free of germs as possible. You can reduce the number of germs on your skin by washing with CHG (chlorahexidine gluconate) Soap before surgery.  CHG is an antiseptic cleaner which kills germs and bonds with the skin to continue killing germs even after washing.  Please do not use if you have an allergy to CHG or antibacterial  soaps. If your skin becomes reddened/irritated stop using the CHG.  Do not shave (including legs and underarms) for at least 48 hours prior to first CHG shower. It is OK to shave your face.  Please follow these instructions carefully.   1. Shower the NIGHT BEFORE SURGERY and the MORNING OF SURGERY with CHG.   2. If you chose to wash your hair, wash your hair first as usual with your normal shampoo.  3. After you shampoo, rinse your hair and body thoroughly to remove the shampoo.  4. Use CHG as you would any other liquid soap. You can apply CHG directly to the skin and wash gently with a scrungie or a clean washcloth.   5. Apply the CHG Soap to your body ONLY FROM THE NECK DOWN.  Do not use on open wounds or open sores. Avoid contact with your eyes, ears, mouth and genitals (private parts). Wash Face and genitals (private parts)  with your normal soap.  6. Wash thoroughly, paying special attention to the area where your surgery will be performed.  7. Thoroughly rinse your body with warm water from the neck down.  8. DO NOT shower/wash with your normal soap after using and rinsing off the CHG Soap.  9. Pat yourself dry  with a CLEAN TOWEL.  10. Wear CLEAN PAJAMAS to bed the night before surgery, wear comfortable clothes the morning of surgery  11. Place CLEAN SHEETS on your bed the night of your first shower and DO NOT SLEEP WITH PETS.    Day of Surgery: Do not apply any deodorants/lotions. Please wear clean clothes to the hospital/surgery center.     Please complete your 8oz of Boost Breeze or Water that was given to you at your preadmission appointment by 0330 on the [place]

## 2017-08-11 ENCOUNTER — Other Ambulatory Visit: Payer: Self-pay | Admitting: General Surgery

## 2017-08-11 DIAGNOSIS — K769 Liver disease, unspecified: Secondary | ICD-10-CM

## 2017-08-13 ENCOUNTER — Encounter (HOSPITAL_COMMUNITY)
Admission: RE | Admit: 2017-08-13 | Discharge: 2017-08-13 | Disposition: A | Discharge status: Home / Self Care | Payer: BLUE CROSS/BLUE SHIELD | Source: Ambulatory Visit | Attending: General Surgery | Admitting: General Surgery

## 2017-08-13 ENCOUNTER — Encounter (HOSPITAL_COMMUNITY): Payer: Self-pay

## 2017-08-13 DIAGNOSIS — K219 Gastro-esophageal reflux disease without esophagitis: Secondary | ICD-10-CM | POA: Diagnosis not present

## 2017-08-13 DIAGNOSIS — R609 Edema, unspecified: Secondary | ICD-10-CM | POA: Diagnosis not present

## 2017-08-13 DIAGNOSIS — Z841 Family history of disorders of kidney and ureter: Secondary | ICD-10-CM | POA: Diagnosis not present

## 2017-08-13 DIAGNOSIS — R1110 Vomiting, unspecified: Secondary | ICD-10-CM | POA: Diagnosis not present

## 2017-08-13 DIAGNOSIS — Z811 Family history of alcohol abuse and dependence: Secondary | ICD-10-CM | POA: Diagnosis not present

## 2017-08-13 DIAGNOSIS — Z86010 Personal history of colonic polyps: Secondary | ICD-10-CM | POA: Diagnosis not present

## 2017-08-13 DIAGNOSIS — Z833 Family history of diabetes mellitus: Secondary | ICD-10-CM | POA: Diagnosis not present

## 2017-08-13 DIAGNOSIS — D122 Benign neoplasm of ascending colon: Secondary | ICD-10-CM | POA: Diagnosis not present

## 2017-08-13 DIAGNOSIS — Y92239 Unspecified place in hospital as the place of occurrence of the external cause: Secondary | ICD-10-CM | POA: Diagnosis not present

## 2017-08-13 DIAGNOSIS — T365X5A Adverse effect of aminoglycosides, initial encounter: Secondary | ICD-10-CM | POA: Diagnosis not present

## 2017-08-13 DIAGNOSIS — E559 Vitamin D deficiency, unspecified: Secondary | ICD-10-CM | POA: Diagnosis not present

## 2017-08-13 DIAGNOSIS — Z8249 Family history of ischemic heart disease and other diseases of the circulatory system: Secondary | ICD-10-CM | POA: Diagnosis not present

## 2017-08-13 HISTORY — DX: Gastro-esophageal reflux disease without esophagitis: K21.9

## 2017-08-13 LAB — BASIC METABOLIC PANEL
Anion gap: 7 (ref 5–15)
BUN: 11 mg/dL (ref 6–20)
CHLORIDE: 106 mmol/L (ref 101–111)
CO2: 25 mmol/L (ref 22–32)
CREATININE: 0.97 mg/dL (ref 0.44–1.00)
Calcium: 9.3 mg/dL (ref 8.9–10.3)
GFR calc non Af Amer: >60 mL/min (ref >60)
Glucose, Bld: 88 mg/dL (ref 65–99)
POTASSIUM: 3.9 mmol/L (ref 3.5–5.1)
Sodium: 138 mmol/L (ref 135–145)

## 2017-08-13 LAB — CBC
HEMATOCRIT: 38.9 % (ref 36.0–46.0)
HEMOGLOBIN: 12.4 g/dL (ref 12.0–15.0)
MCH: 28.2 pg (ref 26.0–34.0)
MCHC: 31.9 g/dL (ref 30.0–36.0)
MCV: 88.4 fL (ref 78.0–100.0)
Platelets: 175 10*3/uL (ref 150–400)
RBC: 4.4 MIL/uL (ref 3.87–5.11)
RDW: 14.2 % (ref 11.5–15.5)
WBC: 4 10*3/uL (ref 4.0–10.5)

## 2017-08-13 NOTE — Progress Notes (Addendum)
No cardio issues, no murmur, no tests. PCP is Dr. Glo Herring  LOV 05/2017 Has the prep info from [place].  ERAS protocol started.

## 2017-08-15 NOTE — Anesthesia Preprocedure Evaluation (Addendum)
Anesthesia Evaluation  Patient identified by MRN, date of birth, ID band Patient awake    Reviewed: Allergy & Precautions, NPO status , Patient's Chart, lab work & pertinent test results  Airway Mallampati: II  TM Distance: >3 FB Neck ROM: Full    Dental  (+) Dental Advisory Given   Pulmonary neg pulmonary ROS,    breath sounds clear to auscultation       Cardiovascular negative cardio ROS   Rhythm:Regular Rate:Normal     Neuro/Psych  Headaches,    GI/Hepatic Neg liver ROS, GERD  ,  Endo/Other  negative endocrine ROS  Renal/GU negative Renal ROS     Musculoskeletal   Abdominal   Peds  Hematology negative hematology ROS (+)   Anesthesia Other Findings   Reproductive/Obstetrics                            Lab Results  Component Value Date   WBC 4.0 08/13/2017   HGB 12.4 08/13/2017   HCT 38.9 08/13/2017   MCV 88.4 08/13/2017   PLT 175 08/13/2017   Lab Results  Component Value Date   CREATININE 0.97 08/13/2017   BUN 11 08/13/2017   NA 138 08/13/2017   K 3.9 08/13/2017   CL 106 08/13/2017   CO2 25 08/13/2017    Anesthesia Physical Anesthesia Plan  ASA: II  Anesthesia Plan: General   Post-op Pain Management:    Induction: Intravenous  PONV Risk Score and Plan: 4 or greater and Ondansetron, Dexamethasone, Midazolam, Scopolamine patch - Pre-op and Treatment may vary due to age or medical condition  Airway Management Planned: Oral ETT  Additional Equipment:   Intra-op Plan:   Post-operative Plan: Extubation in OR  Informed Consent: I have reviewed the patients History and Physical, chart, labs and discussed the procedure including the risks, benefits and alternatives for the proposed anesthesia with the patient or authorized representative who has indicated his/her understanding and acceptance.   Dental advisory given  Plan Discussed with: CRNA  Anesthesia Plan  Comments:        Anesthesia Quick Evaluation

## 2017-08-16 ENCOUNTER — Inpatient Hospital Stay (HOSPITAL_COMMUNITY)
Admission: RE | Admit: 2017-08-16 | Discharge: 2017-08-20 | DRG: 331 | Disposition: A | Discharge status: Home / Self Care | Payer: BLUE CROSS/BLUE SHIELD | Source: Ambulatory Visit | Attending: General Surgery | Admitting: General Surgery

## 2017-08-16 ENCOUNTER — Inpatient Hospital Stay (HOSPITAL_COMMUNITY): Payer: BLUE CROSS/BLUE SHIELD | Admitting: Certified Registered Nurse Anesthetist

## 2017-08-16 ENCOUNTER — Encounter (HOSPITAL_COMMUNITY)
Admission: RE | Disposition: A | Discharge status: Home / Self Care | Payer: Self-pay | Source: Ambulatory Visit | Attending: General Surgery

## 2017-08-16 ENCOUNTER — Encounter (HOSPITAL_COMMUNITY): Payer: Self-pay

## 2017-08-16 DIAGNOSIS — T365X5A Adverse effect of aminoglycosides, initial encounter: Secondary | ICD-10-CM | POA: Diagnosis not present

## 2017-08-16 DIAGNOSIS — Y92239 Unspecified place in hospital as the place of occurrence of the external cause: Secondary | ICD-10-CM | POA: Diagnosis not present

## 2017-08-16 DIAGNOSIS — Z86010 Personal history of colonic polyps: Secondary | ICD-10-CM

## 2017-08-16 DIAGNOSIS — Z811 Family history of alcohol abuse and dependence: Secondary | ICD-10-CM

## 2017-08-16 DIAGNOSIS — E559 Vitamin D deficiency, unspecified: Secondary | ICD-10-CM | POA: Diagnosis not present

## 2017-08-16 DIAGNOSIS — R1110 Vomiting, unspecified: Secondary | ICD-10-CM | POA: Diagnosis not present

## 2017-08-16 DIAGNOSIS — K219 Gastro-esophageal reflux disease without esophagitis: Secondary | ICD-10-CM | POA: Diagnosis present

## 2017-08-16 DIAGNOSIS — Z8249 Family history of ischemic heart disease and other diseases of the circulatory system: Secondary | ICD-10-CM

## 2017-08-16 DIAGNOSIS — Z833 Family history of diabetes mellitus: Secondary | ICD-10-CM

## 2017-08-16 DIAGNOSIS — R609 Edema, unspecified: Secondary | ICD-10-CM | POA: Diagnosis not present

## 2017-08-16 DIAGNOSIS — Z841 Family history of disorders of kidney and ureter: Secondary | ICD-10-CM

## 2017-08-16 DIAGNOSIS — D122 Benign neoplasm of ascending colon: Secondary | ICD-10-CM | POA: Diagnosis not present

## 2017-08-16 DIAGNOSIS — Z9049 Acquired absence of other specified parts of digestive tract: Secondary | ICD-10-CM

## 2017-08-16 HISTORY — PX: LAPAROSCOPIC RIGHT COLECTOMY: SHX5925

## 2017-08-16 HISTORY — PX: COLON SURGERY: SHX602

## 2017-08-16 LAB — CBC
HEMATOCRIT: 35.8 % — AB (ref 36.0–46.0)
HEMOGLOBIN: 11.8 g/dL — AB (ref 12.0–15.0)
MCH: 29.2 pg (ref 26.0–34.0)
MCHC: 33 g/dL (ref 30.0–36.0)
MCV: 88.6 fL (ref 78.0–100.0)
Platelets: 147 10*3/uL — ABNORMAL LOW (ref 150–400)
RBC: 4.04 MIL/uL (ref 3.87–5.11)
RDW: 13.9 % (ref 11.5–15.5)
WBC: 12 10*3/uL — ABNORMAL HIGH (ref 4.0–10.5)

## 2017-08-16 LAB — CREATININE, SERUM
Creatinine, Ser: 1.1 mg/dL — ABNORMAL HIGH (ref 0.44–1.00)
GFR calc Af Amer: >60 mL/min (ref >60)
GFR calc non Af Amer: 55 mL/min — ABNORMAL LOW (ref >60)

## 2017-08-16 SURGERY — COLECTOMY, RIGHT, LAPAROSCOPIC
Anesthesia: General | Laterality: Right

## 2017-08-16 MED ORDER — HYDRALAZINE HCL 20 MG/ML IJ SOLN: 10.0000 mg | INTRAMUSCULAR | Status: DC | PRN

## 2017-08-16 MED ORDER — SUGAMMADEX SODIUM 200 MG/2ML IV SOLN
INTRAVENOUS | Status: AC
  Filled 2017-08-16: qty 2

## 2017-08-16 MED ORDER — LIDOCAINE HCL (CARDIAC) 20 MG/ML IV SOLN
INTRAVENOUS | Status: DC | PRN
  Administered 2017-08-16: 60 mg via INTRAVENOUS

## 2017-08-16 MED ORDER — ACETAMINOPHEN 500 MG PO TABS
1000.0000 mg | ORAL_TABLET | ORAL | Status: AC
  Administered 2017-08-16: 1000 mg via ORAL
  Filled 2017-08-16: qty 2

## 2017-08-16 MED ORDER — TRAMADOL HCL 50 MG PO TABS: 50.0000 mg | ORAL_TABLET | Freq: Four times a day (QID) | ORAL | Status: DC | PRN

## 2017-08-16 MED ORDER — GABAPENTIN 300 MG PO CAPS
300.0000 mg | ORAL_CAPSULE | ORAL | Status: AC
  Administered 2017-08-16: 300 mg via ORAL
  Filled 2017-08-16: qty 1

## 2017-08-16 MED ORDER — ONDANSETRON 4 MG PO TBDP: 4.0000 mg | ORAL_TABLET | Freq: Four times a day (QID) | ORAL | Status: DC | PRN

## 2017-08-16 MED ORDER — ONDANSETRON HCL 4 MG/2ML IJ SOLN
4.0000 mg | Freq: Four times a day (QID) | INTRAMUSCULAR | Status: DC | PRN
  Administered 2017-08-16 (×2): 4 mg via INTRAVENOUS
  Filled 2017-08-16 (×2): qty 2

## 2017-08-16 MED ORDER — PHENYLEPHRINE 40 MCG/ML (10ML) SYRINGE FOR IV PUSH (FOR BLOOD PRESSURE SUPPORT)
PREFILLED_SYRINGE | INTRAVENOUS | Status: AC
  Filled 2017-08-16: qty 10

## 2017-08-16 MED ORDER — CEFOTETAN DISODIUM-DEXTROSE 2-2.08 GM-%(50ML) IV SOLR
2.0000 g | INTRAVENOUS | Status: AC
  Administered 2017-08-16: 2 g via INTRAVENOUS
  Filled 2017-08-16: qty 50

## 2017-08-16 MED ORDER — METHOCARBAMOL 500 MG PO TABS
500.0000 mg | ORAL_TABLET | Freq: Four times a day (QID) | ORAL | Status: DC | PRN
  Administered 2017-08-17 (×2): 500 mg via ORAL
  Filled 2017-08-16 (×2): qty 1

## 2017-08-16 MED ORDER — 0.9 % SODIUM CHLORIDE (POUR BTL) OPTIME
TOPICAL | Status: DC | PRN
  Administered 2017-08-16: 1000 mL

## 2017-08-16 MED ORDER — FENTANYL CITRATE (PF) 100 MCG/2ML IJ SOLN
25.0000 ug | INTRAMUSCULAR | Status: DC | PRN
  Administered 2017-08-16 (×4): 25 ug via INTRAVENOUS

## 2017-08-16 MED ORDER — DEXAMETHASONE SODIUM PHOSPHATE 10 MG/ML IJ SOLN
INTRAMUSCULAR | Status: DC | PRN
  Administered 2017-08-16: 10 mg via INTRAVENOUS

## 2017-08-16 MED ORDER — ROCURONIUM BROMIDE 100 MG/10ML IV SOLN
INTRAVENOUS | Status: DC | PRN
  Administered 2017-08-16: 50 mg via INTRAVENOUS
  Administered 2017-08-16: 10 mg via INTRAVENOUS

## 2017-08-16 MED ORDER — ZOLPIDEM TARTRATE 5 MG PO TABS: 5.0000 mg | ORAL_TABLET | Freq: Every evening | ORAL | Status: DC | PRN

## 2017-08-16 MED ORDER — SUGAMMADEX SODIUM 200 MG/2ML IV SOLN
INTRAVENOUS | Status: DC | PRN
  Administered 2017-08-16: 200 mg via INTRAVENOUS

## 2017-08-16 MED ORDER — CELECOXIB 200 MG PO CAPS
200.0000 mg | ORAL_CAPSULE | Freq: Two times a day (BID) | ORAL | Status: DC
  Administered 2017-08-16 – 2017-08-19 (×8): 200 mg via ORAL
  Filled 2017-08-16 (×8): qty 1

## 2017-08-16 MED ORDER — CHLORHEXIDINE GLUCONATE CLOTH 2 % EX PADS: 6.0000 | MEDICATED_PAD | Freq: Once | CUTANEOUS | Status: DC

## 2017-08-16 MED ORDER — SODIUM CHLORIDE 0.9 % IR SOLN
Status: DC | PRN
  Administered 2017-08-16: 1000 mL

## 2017-08-16 MED ORDER — FENTANYL CITRATE (PF) 250 MCG/5ML IJ SOLN
INTRAMUSCULAR | Status: AC
  Filled 2017-08-16: qty 5

## 2017-08-16 MED ORDER — CELECOXIB 200 MG PO CAPS
200.0000 mg | ORAL_CAPSULE | ORAL | Status: AC
  Administered 2017-08-16: 200 mg via ORAL
  Filled 2017-08-16: qty 1

## 2017-08-16 MED ORDER — PROPOFOL 10 MG/ML IV BOLUS
INTRAVENOUS | Status: DC | PRN
  Administered 2017-08-16: 160 mg via INTRAVENOUS

## 2017-08-16 MED ORDER — EPHEDRINE 5 MG/ML INJ
INTRAVENOUS | Status: AC
  Filled 2017-08-16: qty 10

## 2017-08-16 MED ORDER — KCL IN DEXTROSE-NACL 20-5-0.45 MEQ/L-%-% IV SOLN
INTRAVENOUS | Status: DC
  Administered 2017-08-16 – 2017-08-19 (×5): via INTRAVENOUS
  Filled 2017-08-16 (×5): qty 1000

## 2017-08-16 MED ORDER — BUPIVACAINE-EPINEPHRINE (PF) 0.25% -1:200000 IJ SOLN
INTRAMUSCULAR | Status: AC
  Filled 2017-08-16: qty 30

## 2017-08-16 MED ORDER — OXYCODONE HCL 5 MG PO TABS
5.0000 mg | ORAL_TABLET | ORAL | Status: DC | PRN
  Administered 2017-08-16: 10 mg via ORAL
  Filled 2017-08-16 (×2): qty 2

## 2017-08-16 MED ORDER — BUPIVACAINE HCL (PF) 0.25 % IJ SOLN
INTRAMUSCULAR | Status: DC | PRN
  Administered 2017-08-16: 12 mL

## 2017-08-16 MED ORDER — EPHEDRINE SULFATE 50 MG/ML IJ SOLN
INTRAMUSCULAR | Status: DC | PRN
  Administered 2017-08-16 (×2): 10 mg via INTRAVENOUS
  Administered 2017-08-16: 5 mg via INTRAVENOUS

## 2017-08-16 MED ORDER — PHENYLEPHRINE 40 MCG/ML (10ML) SYRINGE FOR IV PUSH (FOR BLOOD PRESSURE SUPPORT)
PREFILLED_SYRINGE | INTRAVENOUS | Status: DC | PRN
  Administered 2017-08-16 (×3): 40 ug via INTRAVENOUS

## 2017-08-16 MED ORDER — SCOPOLAMINE 1 MG/3DAYS TD PT72
MEDICATED_PATCH | TRANSDERMAL | Status: AC
  Filled 2017-08-16: qty 1

## 2017-08-16 MED ORDER — ONDANSETRON HCL 4 MG/2ML IJ SOLN
INTRAMUSCULAR | Status: DC | PRN
  Administered 2017-08-16: 4 mg via INTRAVENOUS

## 2017-08-16 MED ORDER — PROPOFOL 10 MG/ML IV BOLUS
INTRAVENOUS | Status: AC
  Filled 2017-08-16: qty 20

## 2017-08-16 MED ORDER — DIPHENHYDRAMINE HCL 50 MG/ML IJ SOLN: 25.0000 mg | Freq: Four times a day (QID) | INTRAMUSCULAR | Status: DC | PRN

## 2017-08-16 MED ORDER — ONDANSETRON HCL 4 MG/2ML IJ SOLN
INTRAMUSCULAR | Status: AC
Start: 2017-08-16 — End: ?
  Filled 2017-08-16: qty 2

## 2017-08-16 MED ORDER — PANTOPRAZOLE SODIUM 40 MG IV SOLR
40.0000 mg | Freq: Every day | INTRAVENOUS | Status: DC
  Administered 2017-08-16 – 2017-08-18 (×3): 40 mg via INTRAVENOUS
  Filled 2017-08-16 (×3): qty 40

## 2017-08-16 MED ORDER — FENTANYL CITRATE (PF) 100 MCG/2ML IJ SOLN
INTRAMUSCULAR | Status: AC
  Filled 2017-08-16: qty 2

## 2017-08-16 MED ORDER — ROCURONIUM BROMIDE 10 MG/ML (PF) SYRINGE
PREFILLED_SYRINGE | INTRAVENOUS | Status: AC
  Filled 2017-08-16: qty 5

## 2017-08-16 MED ORDER — MIDAZOLAM HCL 5 MG/5ML IJ SOLN
INTRAMUSCULAR | Status: DC | PRN
  Administered 2017-08-16: 2 mg via INTRAVENOUS

## 2017-08-16 MED ORDER — MIDAZOLAM HCL 2 MG/2ML IJ SOLN
INTRAMUSCULAR | Status: AC
  Filled 2017-08-16: qty 2

## 2017-08-16 MED ORDER — LACTATED RINGERS IV SOLN
INTRAVENOUS | Status: DC | PRN
  Administered 2017-08-16 (×2): via INTRAVENOUS

## 2017-08-16 MED ORDER — DIPHENHYDRAMINE HCL 25 MG PO CAPS: 25.0000 mg | ORAL_CAPSULE | Freq: Four times a day (QID) | ORAL | Status: DC | PRN

## 2017-08-16 MED ORDER — HYDROMORPHONE HCL 1 MG/ML IJ SOLN
0.5000 mg | INTRAMUSCULAR | Status: DC | PRN
  Administered 2017-08-16 – 2017-08-19 (×7): 1 mg via INTRAVENOUS
  Filled 2017-08-16 (×7): qty 1

## 2017-08-16 MED ORDER — PROMETHAZINE HCL 25 MG/ML IJ SOLN: 6.2500 mg | INTRAMUSCULAR | Status: DC | PRN

## 2017-08-16 MED ORDER — CALCIUM CARBONATE ANTACID 500 MG PO CHEW: 2.0000 | CHEWABLE_TABLET | Freq: Every day | ORAL | Status: DC | PRN

## 2017-08-16 MED ORDER — ENOXAPARIN SODIUM 40 MG/0.4ML ~~LOC~~ SOLN
40.0000 mg | SUBCUTANEOUS | Status: DC
  Administered 2017-08-17 – 2017-08-19 (×3): 40 mg via SUBCUTANEOUS
  Filled 2017-08-16 (×3): qty 0.4

## 2017-08-16 MED ORDER — SCOPOLAMINE 1 MG/3DAYS TD PT72
MEDICATED_PATCH | TRANSDERMAL | Status: DC | PRN
  Administered 2017-08-16: 1 via TRANSDERMAL

## 2017-08-16 MED ORDER — DEXAMETHASONE SODIUM PHOSPHATE 10 MG/ML IJ SOLN
INTRAMUSCULAR | Status: AC
  Filled 2017-08-16: qty 1

## 2017-08-16 MED ORDER — ALVIMOPAN 12 MG PO CAPS
12.0000 mg | ORAL_CAPSULE | ORAL | Status: AC
  Administered 2017-08-16: 12 mg via ORAL
  Filled 2017-08-16: qty 1

## 2017-08-16 MED ORDER — LIDOCAINE 2% (20 MG/ML) 5 ML SYRINGE
INTRAMUSCULAR | Status: AC
  Filled 2017-08-16: qty 5

## 2017-08-16 MED ORDER — FENTANYL CITRATE (PF) 100 MCG/2ML IJ SOLN
INTRAMUSCULAR | Status: DC | PRN
  Administered 2017-08-16: 25 ug via INTRAVENOUS
  Administered 2017-08-16: 50 ug via INTRAVENOUS
  Administered 2017-08-16 (×2): 25 ug via INTRAVENOUS
  Administered 2017-08-16: 100 ug via INTRAVENOUS

## 2017-08-16 MED ORDER — GABAPENTIN 300 MG PO CAPS
300.0000 mg | ORAL_CAPSULE | Freq: Two times a day (BID) | ORAL | Status: DC
  Administered 2017-08-16 – 2017-08-19 (×8): 300 mg via ORAL
  Filled 2017-08-16 (×8): qty 1

## 2017-08-16 SURGICAL SUPPLY — 69 items
APPLIER CLIP 5 13 M/L LIGAMAX5 (MISCELLANEOUS)
BLADE CLIPPER SURG (BLADE) IMPLANT
BLADE SURG 10 STRL SS (BLADE) ×2 IMPLANT
CANISTER SUCT 3000ML PPV (MISCELLANEOUS) ×2 IMPLANT
CELLS DAT CNTRL 66122 CELL SVR (MISCELLANEOUS) ×1 IMPLANT
CHLORAPREP W/TINT 26ML (MISCELLANEOUS) ×2 IMPLANT
CLIP APPLIE 5 13 M/L LIGAMAX5 (MISCELLANEOUS) IMPLANT
COVER SURGICAL LIGHT HANDLE (MISCELLANEOUS) ×2 IMPLANT
DERMABOND ADVANCED (GAUZE/BANDAGES/DRESSINGS) ×2
DERMABOND ADVANCED .7 DNX12 (GAUZE/BANDAGES/DRESSINGS) ×2 IMPLANT
DRAPE LAPAROSCOPIC ABDOMINAL (DRAPES) ×2 IMPLANT
DRAPE WARM FLUID 44X44 (DRAPE) ×2 IMPLANT
ELECT BLADE 6.5 EXT (BLADE) IMPLANT
ELECT CAUTERY BLADE 6.4 (BLADE) ×4 IMPLANT
ELECT REM PT RETURN 9FT ADLT (ELECTROSURGICAL) ×2
ELECTRODE REM PT RTRN 9FT ADLT (ELECTROSURGICAL) ×1 IMPLANT
GAUZE SPONGE 4X4 12PLY STRL (GAUZE/BANDAGES/DRESSINGS) IMPLANT
GLOVE BIO SURGEON STRL SZ7 (GLOVE) ×4 IMPLANT
GLOVE BIOGEL M STRL SZ7.5 (GLOVE) ×2 IMPLANT
GLOVE BIOGEL PI IND STRL 6 (GLOVE) ×2 IMPLANT
GLOVE BIOGEL PI IND STRL 7.5 (GLOVE) ×1 IMPLANT
GLOVE BIOGEL PI IND STRL 8 (GLOVE) ×5 IMPLANT
GLOVE BIOGEL PI INDICATOR 6 (GLOVE) ×2
GLOVE BIOGEL PI INDICATOR 7.5 (GLOVE) ×1
GLOVE BIOGEL PI INDICATOR 8 (GLOVE) ×5
GOWN STRL REUS W/ TWL LRG LVL3 (GOWN DISPOSABLE) ×4 IMPLANT
GOWN STRL REUS W/TWL LRG LVL3 (GOWN DISPOSABLE) ×4
KIT BASIN OR (CUSTOM PROCEDURE TRAY) ×2 IMPLANT
KIT ROOM TURNOVER OR (KITS) ×2 IMPLANT
LIGASURE IMPACT 36 18CM CVD LR (INSTRUMENTS) ×2 IMPLANT
NS IRRIG 1000ML POUR BTL (IV SOLUTION) ×4 IMPLANT
PAD ARMBOARD 7.5X6 YLW CONV (MISCELLANEOUS) ×4 IMPLANT
PENCIL BUTTON HOLSTER BLD 10FT (ELECTRODE) ×2 IMPLANT
RELOAD PROXIMATE 75MM BLUE (ENDOMECHANICALS) ×6 IMPLANT
RTRCTR WOUND ALEXIS 18CM MED (MISCELLANEOUS) ×2
SCISSORS LAP 5X35 DISP (ENDOMECHANICALS) IMPLANT
SET IRRIG TUBING LAPAROSCOPIC (IRRIGATION / IRRIGATOR) ×2 IMPLANT
SHEARS HARMONIC ACE PLUS 36CM (ENDOMECHANICALS) IMPLANT
SLEEVE ENDOPATH XCEL 5M (ENDOMECHANICALS) ×2 IMPLANT
SPECIMEN JAR LARGE (MISCELLANEOUS) ×2 IMPLANT
SPONGE LAP 18X18 X RAY DECT (DISPOSABLE) IMPLANT
STAPLER GUN LINEAR PROX 60 (STAPLE) ×2 IMPLANT
STAPLER PROXIMATE 75MM BLUE (STAPLE) ×2 IMPLANT
STAPLER VISISTAT 35W (STAPLE) ×2 IMPLANT
SUCTION POOLE TIP (SUCTIONS) ×2 IMPLANT
SUT PDS AB 1 TP1 54 (SUTURE) IMPLANT
SUT PDS AB 1 TP1 96 (SUTURE) IMPLANT
SUT SILK 2 0 (SUTURE) ×1
SUT SILK 2 0 SH CR/8 (SUTURE) ×2 IMPLANT
SUT SILK 2-0 18XBRD TIE 12 (SUTURE) ×1 IMPLANT
SUT SILK 3 0 (SUTURE) ×1
SUT SILK 3 0 SH CR/8 (SUTURE) ×4 IMPLANT
SUT SILK 3-0 18XBRD TIE 12 (SUTURE) ×1 IMPLANT
SUT VIC AB 3-0 SH 8-18 (SUTURE) IMPLANT
SYR BULB IRRIGATION 50ML (SYRINGE) ×2 IMPLANT
SYS LAPSCP GELPORT 120MM (MISCELLANEOUS)
SYSTEM LAPSCP GELPORT 120MM (MISCELLANEOUS) IMPLANT
TOWEL OR 17X24 6PK STRL BLUE (TOWEL DISPOSABLE) ×2 IMPLANT
TOWEL OR 17X26 10 PK STRL BLUE (TOWEL DISPOSABLE) ×2 IMPLANT
TRAY FOLEY W/METER SILVER 14FR (SET/KITS/TRAYS/PACK) ×2 IMPLANT
TRAY LAPAROSCOPIC MC (CUSTOM PROCEDURE TRAY) ×2 IMPLANT
TROCAR XCEL BLUNT TIP 100MML (ENDOMECHANICALS) IMPLANT
TROCAR XCEL NON-BLD 11X100MML (ENDOMECHANICALS) ×2 IMPLANT
TROCAR XCEL NON-BLD 5MMX100MML (ENDOMECHANICALS) ×2 IMPLANT
TUBE CONNECTING 12X1/4 (SUCTIONS) ×2 IMPLANT
TUBING INSUF HEATED (TUBING) ×2 IMPLANT
TUBING INSUFFLATION (TUBING) ×2 IMPLANT
WATER STERILE IRR 1000ML POUR (IV SOLUTION) ×4 IMPLANT
YANKAUER SUCT BULB TIP NO VENT (SUCTIONS) ×4 IMPLANT

## 2017-08-16 NOTE — H&P (Signed)
Charlottie Peragine 07/25/2017 10:02 AM Location: [facility] Patient #: [id number] DOB: Jan 02, 1961 Married / Language: English / Race: Black or African American Female   History of Present Illness Lavone Neri E. Grandville Silos MD; 07/25/2017 10:50 AM) The patient is a 56 year old female who presents with a colonic polyp. I was asked to see Lujean Rave in consultation by Dr. Clarene Essex in regards to an ascending colon polyp. She has a history of colon polyps in the past. She developed bloody stools for about 2 weeks. She underwent colonoscopy and was found to have large polyp in her ascending colon. Biopsies were taken and demonstrated an adenomatous polyp with high-grade dysplasia. It was not completely removed. Dr. Watt Climes asked me to see her for consideration of laparoscopic assisted right colectomy. She has had no further blood with bowel movements since this last colonoscopy. She occasionally has loose stools. No constipation. No abdominal pain.   Past Surgical History Malachy Moan, [place]; 07/25/2017 10:02 AM) Cesarean Section - 1  Colon Polyp Removal - Colonoscopy   Diagnostic Studies History Malachy Moan, RMA; 07/25/2017 10:02 AM) Colonoscopy  within last year Mammogram  within last year Pap Smear  1-5 years ago  Allergies Malachy Moan, RMA; 07/25/2017 10:03 AM) No Known Allergies 07/25/2017  Medication History Malachy Moan, RMA; 07/25/2017 10:04 AM) Multivitamins (Oral) Active. Medications Reconciled  Social History Malachy Moan, [place]; 07/25/2017 10:03 AM) Caffeine use  Carbonated beverages. No alcohol use  No drug use  Tobacco use  Never smoker.  Family History Malachy Moan, [place]; 07/25/2017 10:02 AM) Alcohol Abuse  Father. Diabetes Mellitus  Mother. Heart Disease  Mother. Kidney Disease  Mother. Migraine Headache  Sister. Thyroid problems  Mother.  Pregnancy / Birth History Malachy Moan, [place]; 07/25/2017 10:03 AM) Age  at menarche  14 years. Age of menopause  27-60 Gravida  2 Irregular periods  Maternal age  88-20 Para  1  Other Problems Malachy Moan, [place]; 07/25/2017 10:03 AM) No pertinent past medical history  Oophorectomy     Review of Systems Malachy Moan RMA; 07/25/2017 10:03 AM) General Present- Night Sweats. Not Present- Appetite Loss, Chills, Fatigue, Fever, Weight Gain and Weight Loss. Skin Not Present- Change in Wart/Mole, Dryness, Hives, Jaundice, New Lesions, Non-Healing Wounds, Rash and Ulcer. HEENT Present- Wears glasses/contact lenses. Not Present- Earache, Hearing Loss, Hoarseness, Nose Bleed, Oral Ulcers, Ringing in the Ears, Seasonal Allergies, Sinus Pain, Sore Throat, Visual Disturbances and Yellow Eyes. Respiratory Not Present- Bloody sputum, Chronic Cough, Difficulty Breathing, Snoring and Wheezing. Breast Not Present- Breast Mass, Breast Pain, Nipple Discharge and Skin Changes. Cardiovascular Not Present- Chest Pain, Difficulty Breathing Lying Down, Leg Cramps, Palpitations, Rapid Heart Rate, Shortness of Breath and Swelling of Extremities. Gastrointestinal Not Present- Abdominal Pain, Bloating, Bloody Stool, Change in Bowel Habits, Chronic diarrhea, Constipation, Difficulty Swallowing, Excessive gas, Gets full quickly at meals, Hemorrhoids, Indigestion, Nausea, Rectal Pain and Vomiting. Female Genitourinary Not Present- Frequency, Nocturia, Painful Urination, Pelvic Pain and Urgency. Musculoskeletal Not Present- Back Pain, Joint Pain, Joint Stiffness, Muscle Pain, Muscle Weakness and Swelling of Extremities. Neurological Not Present- Decreased Memory, Fainting, Headaches, Numbness, Seizures, Tingling, Tremor, Trouble walking and Weakness. Psychiatric Not Present- Anxiety, Bipolar, Change in Sleep Pattern, Depression, Fearful and Frequent crying. Endocrine Present- Hot flashes. Not Present- Cold Intolerance, Excessive Hunger, Hair Changes, Heat Intolerance and New  Diabetes. Hematology Not Present- Blood Thinners, Easy Bruising, Excessive bleeding, Gland problems, HIV and Persistent Infections.  Vitals Malachy Moan RMA; 07/25/2017 10:04 AM) 07/25/2017 10:04 AM Weight: 177.4 lb Height:  66in Body Surface Area: 1.9 m Body Mass Index: 28.63 kg/m  Temp.: 97.59F  Pulse: 81 (Regular)  BP: 104/80 (Sitting, Left Arm, Standard)       Physical Exam (Dreonna Hussein E. Grandville Silos MD; 07/25/2017 10:50 AM) General Mental Status-Alert. General Appearance-Consistent with stated age. Hydration-Well hydrated. Voice-Normal.  Head and Neck Head-normocephalic, atraumatic with no lesions or palpable masses. Trachea-midline. Thyroid Gland Characteristics - normal size and consistency.  Eye Eyeball - Bilateral-Extraocular movements intact. Sclera/Conjunctiva - Bilateral-No scleral icterus.  Chest and Lung Exam Chest and lung exam reveals -quiet, even and easy respiratory effort with no use of accessory muscles and on auscultation, normal breath sounds, no adventitious sounds and normal vocal resonance. Inspection Chest Wall - Normal. Back - normal.  Cardiovascular Cardiovascular examination reveals -normal heart sounds, regular rate and rhythm with no murmurs and normal pedal pulses bilaterally.  Abdomen Inspection Inspection of the abdomen reveals - No Hernias. Palpation/Percussion Palpation and Percussion of the abdomen reveal - Soft, Non Tender, No Rebound tenderness, No Rigidity (guarding) and No hepatosplenomegaly. Auscultation Auscultation of the abdomen reveals - Bowel sounds normal. Note: Lower midline scar from previous myomectomy and cesarean section   Neurologic Neurologic evaluation reveals -alert and oriented x 3 with no impairment of recent or remote memory. Mental Status-Normal.  Musculoskeletal Global Assessment -Note: no gross deformities.  Normal Exam - Left-Upper Extremity Strength Normal  and Lower Extremity Strength Normal. Normal Exam - Right-Upper Extremity Strength Normal and Lower Extremity Strength Normal.  Lymphatic Head & Neck  General Head & Neck Lymphatics: Bilateral - Description - Normal. Axillary  General Axillary Region: Bilateral - Description - Normal. Tenderness - Non Tender. Femoral & Inguinal  Generalized Femoral & Inguinal Lymphatics: Bilateral - Description - No Generalized lymphadenopathy.    Assessment & Plan Lavone Neri E. Grandville Silos MD; 07/25/2017 10:52 AM) ADENOMATOUS POLYP OF ASCENDING COLON (D12.2) Impression: I have offered laparoscopic-assisted right colectomy. I discussed the procedure, risks, and benefits with her in detail. I also discussed the expected postoperative course and the possibility that there may actually be a small colon cancer within this polyp. I described the colon prep. Additionally, I agree with Dr. Perley Jain recommendation to obtain a CT scan of her abdomen and pelvis preoperatively. I will check her renal function, which was normal in August, prior to the scan and we look forward to scheduling her surgery.  08/16/17 - vomited some after neomycin. Otherwise prep went OK. MR scheduled. No other changes. I spoke with her husband.   Georganna Skeans, MD, MPH, FACS Trauma: [phone] General Surgery: [phone]

## 2017-08-16 NOTE — Progress Notes (Signed)
Patient admitted to [facility]. From PACU. Arouses to voice. Lap incisions unremarkable. Foley draining. Oriented to room. Call bell in reach. Repositioned for comfort. Family at bedside.

## 2017-08-16 NOTE — Progress Notes (Signed)
Orthopedic Tech Progress Note Patient Details:  Marie Vazquez Nov 27, 1960 [id number]  Ortho Devices Type of Ortho Device: Abdominal binder Ortho Device/Splint Location: abdomen Ortho Device/Splint Interventions: Marie Vazquez, Marie Vazquez 08/16/2017, 1:44 PM

## 2017-08-16 NOTE — Anesthesia Procedure Notes (Signed)
Procedure Name: Intubation Date/Time: 08/16/2017 7:42 AM Performed by: Suzette Battiest Pre-anesthesia Checklist: Patient identified, Emergency Drugs available, Suction available, Patient being monitored and Timeout performed Patient Re-evaluated:Patient Re-evaluated prior to induction Oxygen Delivery Method: Circle system utilized Preoxygenation: Pre-oxygenation with 100% oxygen Induction Type: IV induction Ventilation: Mask ventilation without difficulty Laryngoscope Size: Mac and 3 Grade View: Grade I Tube type: Oral Tube size: 7.0 mm Number of attempts: 1 Airway Equipment and Method: Stylet Placement Confirmation: ETT inserted through vocal cords under direct vision,  positive ETCO2,  CO2 detector and breath sounds checked- equal and bilateral Secured at: 21 cm Tube secured with: Tape Dental Injury: Teeth and Oropharynx as per pre-operative assessment

## 2017-08-16 NOTE — Progress Notes (Signed)
Patient ID: Marie Vazquez, female   DOB: [birth year], 56 y.o.   MRN: [id number] Doing well post-op. I spoke with her husband. Georganna Skeans, MD, MPH, FACS Trauma: [phone] General Surgery: [phone]

## 2017-08-16 NOTE — Op Note (Signed)
08/16/2017  9:39 AM  PATIENT:  Marie Vazquez  56 y.o. female  PRE-OPERATIVE DIAGNOSIS:  right colon polyp  POST-OPERATIVE DIAGNOSIS:  right colon polyp  PROCEDURE:  Procedure(s): LAPAROSCOPIC ASSISTED  RIGHT COLECTOMY ERAS PATHWAY  SURGEON: Georganna Skeans, MD  ASSISTANTS: Annye English, MD  ANESTHESIA:   local and general  EBL:  Total I/O In: 1000 [I.V.:1000] Out: 42 [Urine:50; Blood:25]  BLOOD ADMINISTERED:none  DRAINS: none   SPECIMEN:  Excision  DISPOSITION OF SPECIMEN:  PATHOLOGY  COUNTS:  YES  DICTATION: .Dragon Dictation Findings: Polypectomy site in proximal ascending colon with tattoo and minimally palpable residual polyp.  No significant intra-abdominal adhesions from previous surgery.  Procedure in detail:  Floreen Comber presents for laparoscopic-assisted right colectomy for a tubulovillous adenoma with high-grade dysplasia in her proximal ascending colon.  She was identified in the preop holding area.  She was given IV antibiotics.  Informed consent was obtained.  She was brought to the operating room and general endotracheal anesthesia was administered by the anesthesia staff.  Foley catheter was placed by nursing.  Her abdomen was prepped and draped in a sterile fashion.  We did a timeout procedure.The supraumbilical region was infiltrated with local. supraumbilical incision was made. Subcutaneous tissues were dissected down revealing the anterior fascia. This was divided sharply along the midline. Peritoneal cavity was entered under direct vision without complication. A 0 Vicryl pursestring was placed around the fascial opening. Hassan trocar was inserted into the abdomen. The abdomen was insufflated with carbon dioxide in standard fashion. Under direct vision a 5 mm lower midline port a 5 mm epigastric port and a 5 mm right lower quadrant port were placed.  Local was used at each port site.  Laparoscopic exploration revealed the polyp site visible with tattooing ink.  The  right colon was then mobilized from lateral peritoneal attachments from the terminal ileum up to the hepatic flexure gradually.  We then continued mobilization through the hepatic flexure using Harmonic scalpel until we identified the middle colic vessels.  The ureter was avoided.  There was good visualization of her anatomy.  Hemostasis was ensured.  Next, I removed the [facility] trocar and extended the midline incision inferiorly to just below the umbilicus.Colon  Protocol draping was applied.  A wound protector was inserted.  The mobilized right colon was brought out.  It was divided just proximal to the middle colic vessels with a NFA-21 stapler.  The terminal ileum was divided with a GIA-75 stapler.  We then took down the mesentery using LigaSure.  We used Kelly clamps and silk ties on the ileocolonic artery.  The specimen was sent to pathology.  Hemostasis was ensured in the mesentery.  We then did a side-to-side anastomosis of the distal ileum to the transverse colon with GIA 75 stapler.  The common defect was closed with TA 60.  Multiple 3 oh silks were placed along the staple line good good hemostasis and for reinforcement.  Apical stitches of 2-0 silk were placed.  The mesenteric defect was closed with a couple 2-0 silk sutures.  The anastomosis was widely patent and viable.  We dropped back into the abdomen and reinsufflated.  The abdomen was irrigated and hemostasis was ensured.  Bowel was placed in anatomic position.  We changed gown, gloves, instruments, and drapes all in accordance with the colon surgery protocol.  Midline incision was then closed with #1 looped PDS from each end and tied in the middle.  Subcutaneous tissues were irrigated and closed with running  4-0 Vicryl followed by Dermabond.  All counts were correct.  She tolerated the procedure without apparent complication and was taken recovery in stable condition.  PATIENT DISPOSITION:  PACU - hemodynamically stable.   Delay start of  Pharmacological VTE agent (>24hrs) due to surgical blood loss or risk of bleeding:  no  Georganna Skeans, MD, MPH, FACS Pager: [phone]  11/1/20189:39 AM

## 2017-08-16 NOTE — Anesthesia Postprocedure Evaluation (Signed)
Anesthesia Post Note  Patient: Marie Vazquez  Procedure(s) Performed: LAPAROSCOPIC ASSISTED  RIGHT COLECTOMY ERAS PATHWAY (Right )     Patient location during evaluation: PACU Anesthesia Type: General Level of consciousness: awake and alert Pain management: pain level controlled Vital Signs Assessment: post-procedure vital signs reviewed and stable Respiratory status: spontaneous breathing, nonlabored ventilation, respiratory function stable and patient connected to nasal cannula oxygen Cardiovascular status: blood pressure returned to baseline and stable Postop Assessment: no apparent nausea or vomiting Anesthetic complications: no    Last Vitals:  Vitals:   08/16/17 1139 08/16/17 1200  BP:  109/61  Pulse:  96  Resp:  18  Temp: (!) 36.4 C 36.6 C  SpO2:  100%    Last Pain:  Vitals:   08/16/17 1245  TempSrc:   PainSc: 8                  Tiajuana Amass

## 2017-08-16 NOTE — Transfer of Care (Signed)
Immediate Anesthesia Transfer of Care Note  Patient: Marie Vazquez  Procedure(s) Performed: LAPAROSCOPIC ASSISTED  RIGHT COLECTOMY ERAS PATHWAY (Right )  Patient Location: PACU  Anesthesia Type:General  Level of Consciousness: awake, alert  and oriented  Airway & Oxygen Therapy: Patient Spontanous Breathing and Patient connected to face mask oxygen  Post-op Assessment: Report given to RN and Post -op Vital signs reviewed and stable  Post vital signs: Reviewed and stable  Last Vitals:  Vitals:   08/16/17 0556 08/16/17 0558  BP:  106/64  Pulse: 71   Resp: 20   Temp: 36.7 C   SpO2: 100%     Last Pain:  Vitals:   08/16/17 0556  TempSrc: Oral         Complications: No apparent anesthesia complications

## 2017-08-17 ENCOUNTER — Encounter (HOSPITAL_COMMUNITY): Payer: Self-pay | Admitting: General Surgery

## 2017-08-17 LAB — BASIC METABOLIC PANEL
ANION GAP: 9 (ref 5–15)
BUN: 7 mg/dL (ref 6–20)
CALCIUM: 8.7 mg/dL — AB (ref 8.9–10.3)
CO2: 20 mmol/L — AB (ref 22–32)
Chloride: 107 mmol/L (ref 101–111)
Creatinine, Ser: 1.05 mg/dL — ABNORMAL HIGH (ref 0.44–1.00)
GFR, EST NON AFRICAN AMERICAN: 58 mL/min — AB (ref >60)
Glucose, Bld: 127 mg/dL — ABNORMAL HIGH (ref 65–99)
Potassium: 3.9 mmol/L (ref 3.5–5.1)
Sodium: 136 mmol/L (ref 135–145)

## 2017-08-17 LAB — CBC
HEMATOCRIT: 35.3 % — AB (ref 36.0–46.0)
Hemoglobin: 11.3 g/dL — ABNORMAL LOW (ref 12.0–15.0)
MCH: 28 pg (ref 26.0–34.0)
MCHC: 32 g/dL (ref 30.0–36.0)
MCV: 87.6 fL (ref 78.0–100.0)
PLATELETS: 173 10*3/uL (ref 150–400)
RBC: 4.03 MIL/uL (ref 3.87–5.11)
RDW: 14.2 % (ref 11.5–15.5)
WBC: 9.7 10*3/uL (ref 4.0–10.5)

## 2017-08-17 NOTE — Progress Notes (Signed)
1 Day Post-Op   Subjective/Chief Complaint: Walked yesterday evening, no flatus, sore   Objective: Vital signs in last 24 hours: Temp:  [97.5 F (36.4 C)-98.6 F (37 C)] 98.5 F (36.9 C) (11/02 0430) Pulse Rate:  [65-130] 70 (11/02 0430) Resp:  [13-23] 18 (11/02 0430) BP: (101-135)/(52-77) 102/57 (11/02 0430) SpO2:  [97 %-100 %] 98 % (11/02 0430) Last BM Date: 08/15/17  Intake/Output from previous day: 11/01 0701 - 11/02 0700 In: 3460 [P.O.:220; I.V.:3090] Out: 1095 [Urine:1070; Blood:25] Intake/Output this shift: No intake/output data recorded.  General appearance: alert and cooperative Resp: clear to auscultation bilaterally Cardio: regular rate and rhythm GI: soft, a few BS, incisions CDI  Lab Results:   Recent Labs  08/16/17 1403 08/17/17 0343  WBC 12.0* 9.7  HGB 11.8* 11.3*  HCT 35.8* 35.3*  PLT 147* 173   BMET  Recent Labs  08/16/17 1403 08/17/17 0343  NA  --  136  K  --  3.9  CL  --  107  CO2  --  20*  GLUCOSE  --  127*  BUN  --  7  CREATININE 1.10* 1.05*  CALCIUM  --  8.7*   PT/INR No results for input(s): LABPROT, INR in the last 72 hours. ABG No results for input(s): PHART, HCO3 in the last 72 hours.  Invalid input(s): PCO2, PO2  Studies/Results: No results found.  Anti-infectives: Anti-infectives    Start     Dose/Rate Route Frequency Ordered Stop   08/16/17 0601  cefoTEtan in Dextrose 5% (CEFOTAN) IVPB 2 g     2 g Intravenous On call to O.R. 08/16/17 0601 08/16/17 0815      Assessment/Plan: s/p Procedure(s): LAPAROSCOPIC ASSISTED  RIGHT COLECTOMY ERAS PATHWAY (Right)08/16/17 POD#1 - clears, await bowel function VTE - Lovenox  LOS: 1 day    Blessing Ozga E 08/17/2017

## 2017-08-17 NOTE — Progress Notes (Signed)
Pt ambulated one full lap on unit

## 2017-08-18 LAB — CBC
HEMATOCRIT: 35.2 % — AB (ref 36.0–46.0)
HEMOGLOBIN: 11.1 g/dL — AB (ref 12.0–15.0)
MCH: 28.2 pg (ref 26.0–34.0)
MCHC: 31.5 g/dL (ref 30.0–36.0)
MCV: 89.3 fL (ref 78.0–100.0)
Platelets: 162 10*3/uL (ref 150–400)
RBC: 3.94 MIL/uL (ref 3.87–5.11)
RDW: 14.9 % (ref 11.5–15.5)
WBC: 8.1 10*3/uL (ref 4.0–10.5)

## 2017-08-18 LAB — BASIC METABOLIC PANEL
Anion gap: 8 (ref 5–15)
BUN: 7 mg/dL (ref 6–20)
CHLORIDE: 106 mmol/L (ref 101–111)
CO2: 22 mmol/L (ref 22–32)
CREATININE: 1.1 mg/dL — AB (ref 0.44–1.00)
Calcium: 8.9 mg/dL (ref 8.9–10.3)
GFR calc non Af Amer: 55 mL/min — ABNORMAL LOW (ref >60)
Glucose, Bld: 82 mg/dL (ref 65–99)
Potassium: 3.8 mmol/L (ref 3.5–5.1)
Sodium: 136 mmol/L (ref 135–145)

## 2017-08-18 MED ORDER — BOOST / RESOURCE BREEZE PO LIQD
1.0000 | Freq: Three times a day (TID) | ORAL | Status: DC
  Administered 2017-08-18 – 2017-08-19 (×5): 1 via ORAL

## 2017-08-18 NOTE — Progress Notes (Signed)
2 Days Post-Op   Subjective/Chief Complaint: No flatus, no nausea. Pain well controlled.    Objective: Vital signs in last 24 hours: Temp:  [98.6 F (37 C)-99.2 F (37.3 C)] 98.6 F (37 C) (11/03 0636) Pulse Rate:  [62-89] 89 (11/03 0636) Resp:  [18] 18 (11/03 0636) BP: (93-122)/(55-59) 122/55 (11/03 0636) SpO2:  [96 %-98 %] 96 % (11/03 0636) Last BM Date: 08/15/17  Intake/Output from previous day: 11/02 0701 - 11/03 0700 In: 1660 [P.O.:400; I.V.:1260] Out: 700 [Urine:700] Intake/Output this shift: No intake/output data recorded.  General appearance: alert and cooperative Resp: clear to auscultation bilaterally Cardio: regular rate and rhythm GI: soft, a few BS, incisions CDI  Lab Results:   Recent Labs  08/17/17 0343 08/18/17 0624  WBC 9.7 8.1  HGB 11.3* 11.1*  HCT 35.3* 35.2*  PLT 173 162   BMET  Recent Labs  08/17/17 0343 08/18/17 0624  NA 136 136  K 3.9 3.8  CL 107 106  CO2 20* 22  GLUCOSE 127* 82  BUN 7 7  CREATININE 1.05* 1.10*  CALCIUM 8.7* 8.9   PT/INR No results for input(s): LABPROT, INR in the last 72 hours. ABG No results for input(s): PHART, HCO3 in the last 72 hours.  Invalid input(s): PCO2, PO2  Studies/Results: No results found.  Anti-infectives: Anti-infectives    Start     Dose/Rate Route Frequency Ordered Stop   08/16/17 0601  cefoTEtan in Dextrose 5% (CEFOTAN) IVPB 2 g     2 g Intravenous On call to O.R. 08/16/17 0601 08/16/17 0815      Assessment/Plan: s/p Procedure(s): LAPAROSCOPIC ASSISTED  RIGHT COLECTOMY ERAS PATHWAY (Right)08/16/17 POD#2 - clears, await bowel function, add breeze/ boost VTE - Lovenox  LOS: 2 days    Clovis Riley 08/18/2017

## 2017-08-19 ENCOUNTER — Other Ambulatory Visit: Payer: Self-pay

## 2017-08-19 LAB — BASIC METABOLIC PANEL
ANION GAP: 6 (ref 5–15)
BUN: 5 mg/dL — ABNORMAL LOW (ref 6–20)
CALCIUM: 8.5 mg/dL — AB (ref 8.9–10.3)
CHLORIDE: 105 mmol/L (ref 101–111)
CO2: 26 mmol/L (ref 22–32)
Creatinine, Ser: 0.91 mg/dL (ref 0.44–1.00)
GFR calc non Af Amer: >60 mL/min (ref >60)
GLUCOSE: 98 mg/dL (ref 65–99)
Potassium: 3.5 mmol/L (ref 3.5–5.1)
Sodium: 137 mmol/L (ref 135–145)

## 2017-08-19 LAB — CBC
HEMATOCRIT: 31.7 % — AB (ref 36.0–46.0)
HEMOGLOBIN: 10.1 g/dL — AB (ref 12.0–15.0)
MCH: 28.5 pg (ref 26.0–34.0)
MCHC: 31.9 g/dL (ref 30.0–36.0)
MCV: 89.5 fL (ref 78.0–100.0)
Platelets: 140 10*3/uL — ABNORMAL LOW (ref 150–400)
RBC: 3.54 MIL/uL — ABNORMAL LOW (ref 3.87–5.11)
RDW: 14.6 % (ref 11.5–15.5)
WBC: 5 10*3/uL (ref 4.0–10.5)

## 2017-08-19 LAB — MAGNESIUM: Magnesium: 1.7 mg/dL (ref 1.7–2.4)

## 2017-08-19 MED ORDER — PANTOPRAZOLE SODIUM 40 MG PO TBEC
40.0000 mg | DELAYED_RELEASE_TABLET | Freq: Every day | ORAL | Status: DC
  Administered 2017-08-19: 40 mg via ORAL
  Filled 2017-08-19: qty 1

## 2017-08-19 NOTE — Progress Notes (Signed)
3 Days Post-Op    CC:  Abnormal colon polyp  Subjective: Appears comfortable in bed.  Taking clears without any problems.  No flatus so far.  Port sites and incision looks good.  Objective: Vital signs in last 24 hours: Temp:  [97.9 F (36.6 C)-99.3 F (37.4 C)] 97.9 F (36.6 C) (11/04 0511) Pulse Rate:  [58-66] 60 (11/04 0511) Resp:  [18] 18 (11/03 2056) BP: (97-106)/(53-60) 97/53 (11/04 0511) SpO2:  [96 %-99 %] 96 % (11/04 0511) Last BM Date: 08/15/17   500 IV 600 urine Afebrile vital signs are stable Labs are all essentially normal.  Hemoglobin and hematocrit are stable.  Down slightly from admission.  Intake/Output from previous day: 11/03 0701 - 11/04 0700 In: 1105.8 [P.O.:600; I.V.:505.8] Out: 600 [Urine:600] Intake/Output this shift: No intake/output data recorded.  General appearance: alert, cooperative and no distress Resp: clear to auscultation bilaterally GI: Soft, sore, not distended bowel sounds are hypoactive.  No flatus or BM so far.  Lab Results:  Recent Labs    08/18/17 0624 08/19/17 0531  WBC 8.1 5.0  HGB 11.1* 10.1*  HCT 35.2* 31.7*  PLT 162 140*    BMET Recent Labs    08/18/17 0624 08/19/17 0531  NA 136 137  K 3.8 3.5  CL 106 105  CO2 22 26  GLUCOSE 82 98  BUN 7 5*  CREATININE 1.10* 0.91  CALCIUM 8.9 8.5*   PT/INR No results for input(s): LABPROT, INR in the last 72 hours.  No results for input(s): AST, ALT, ALKPHOS, BILITOT, PROT, ALBUMIN in the last 168 hours.   Lipase  No results found for: LIPASE   Prior to Admission medications   Medication Sig Start Date End Date Taking? Authorizing Provider  calcium carbonate (TUMS - DOSED IN MG ELEMENTAL CALCIUM) 500 MG chewable tablet Chew 2 tablets by mouth daily as needed for indigestion or heartburn.   Yes [provider]  hydrocortisone cream 1 % Apply 1 application topically daily. On chin   Yes [provider]  metroNIDAZOLE (FLAGYL) 500 MG tablet Take 1,000  mg by mouth 3 (three) times daily.  07/25/17  Yes [provider]  Multiple Vitamin (MULTIVITAMIN WITH MINERALS) TABS tablet Take 2 tablets by mouth daily.    Yes [provider]  neomycin (MYCIFRADIN) 500 MG tablet Take 1,000 mg by mouth 3 (three) times daily.  07/25/17  Yes [provider]  hydrOXYzine (ATARAX/VISTARIL) 25 MG tablet Take 1 tablet (25 mg total) by mouth 3 (three) times daily as needed. Patient not taking: Reported on 05/16/2017 01/21/16   Denita Lung, MD    Medications: . celecoxib  200 mg Oral BID  . enoxaparin (LOVENOX) injection  40 mg Subcutaneous Q24H  . feeding supplement  1 Container Oral TID BM  . gabapentin  300 mg Oral BID  . pantoprazole (PROTONIX) IV  40 mg Intravenous QHS   . dextrose 5 % and 0.45 % NaCl with KCl 20 mEq/L 50 mL/hr at 08/19/17 0516   Anti-infectives (From admission, onward)   Start     Dose/Rate Route Frequency Ordered Stop   08/16/17 0601  cefoTEtan in Dextrose 5% (CEFOTAN) IVPB 2 g     2 g Intravenous On call to O.R. 08/16/17 0601 08/16/17 0815     Assessment/Plan Colonic polyp with high-grade dysplasia. Laparoscopic assisted right colectomy ERAS pathway, 08/16/17 Dr. Georganna Skeans. FEN:  Clear diet/IV fluids ID: Preop cefotetan DVT: Lovenox   Plan: Continue to mobilize, clear liquids  still she is having some flatus. Has had some flatus and small Bm, will up to full liquids.      LOS: 3 days    Marie Vazquez 08/19/2017 [phone]

## 2017-08-19 NOTE — Plan of Care (Signed)
  Adequate for Discharge Health Behavior/Discharge Planning: Ability to manage health-related needs will improve 08/19/2017 1510 - Adequate for Discharge by Ainsley Spinner, RN Pain Managment: General experience of comfort will improve 08/19/2017 1510 - Adequate for Discharge by Ainsley Spinner, RN Physical Regulation: Ability to maintain clinical measurements within normal limits will improve 08/19/2017 1510 - Adequate for Discharge by Ainsley Spinner, RN Will remain free from infection 08/19/2017 1510 - Adequate for Discharge by Ainsley Spinner, RN Skin Integrity: Risk for impaired skin integrity will decrease 08/19/2017 1510 - Adequate for Discharge by Ainsley Spinner, RN Tissue Perfusion: Risk factors for ineffective tissue perfusion will decrease 08/19/2017 1510 - Adequate for Discharge by Ainsley Spinner, RN Activity: Risk for activity intolerance will decrease 08/19/2017 1510 - Adequate for Discharge by Ainsley Spinner, RN Fluid Volume: Ability to maintain a balanced intake and output will improve 08/19/2017 1510 - Adequate for Discharge by Ainsley Spinner, RN Nutrition: Adequate nutrition will be maintained 08/19/2017 1510 - Adequate for Discharge by Ainsley Spinner, RN Bowel/Gastric: Will not experience complications related to bowel motility 08/19/2017 1510 - Adequate for Discharge by Ainsley Spinner, RN Physical Regulation: Postoperative complications will be avoided or minimized 08/19/2017 1510 - Adequate for Discharge by Ainsley Spinner, RN Respiratory: Ability to achieve and maintain a regular respiratory rate will improve 08/19/2017 1510 - Adequate for Discharge by Ainsley Spinner, RN Skin Integrity: Demonstration of wound healing without infection will improve 08/19/2017 1510 - Adequate for Discharge by Ainsley Spinner, RN

## 2017-08-20 MED ORDER — OXYCODONE HCL 5 MG PO TABS: 5.0000 mg | ORAL_TABLET | ORAL | 0 refills | Status: DC | PRN

## 2017-08-20 MED ORDER — GABAPENTIN 300 MG PO CAPS: 300.0000 mg | ORAL_CAPSULE | Freq: Two times a day (BID) | ORAL | 0 refills | Status: DC

## 2017-08-20 MED ORDER — METHOCARBAMOL 500 MG PO TABS: 500.0000 mg | ORAL_TABLET | Freq: Four times a day (QID) | ORAL | 0 refills | Status: DC | PRN

## 2017-08-20 NOTE — Discharge Planning (Signed)
Patient discharged home in stable condition. Verbalizes understanding of all discharge instructions, including home medications and follow up appointments. 

## 2017-08-20 NOTE — Discharge Summary (Signed)
Physician Discharge Summary  Patient ID: Marie Vazquez MRN: [id number] DOB/AGE: 02/03/1961 56 y.o.  Admit date: 08/16/2017 Discharge date: 08/20/2017  Admission Diagnoses:R colon polyp  Discharge Diagnoses: S/P laparoscopic R colectomy Active Problems:   S/P laparoscopic colectomy   Discharged Condition: good  Hospital Course: Marie Vazquez underwent a laparoscopic R colectomy. Post-op, her bowel function gradually returned. Pain was managed by PO meds and she tolerated a reg diet. D/C home POD#4.  Consults: None  Significant Diagnostic Studies: no  Treatments: Surgery above  Discharge Exam: Blood pressure 113/62, pulse 74, temperature 98.7 F (37.1 C), temperature source Oral, resp. rate 18, height 5' 5.5" (1.664 m), weight 80.6 kg (177 lb 11.1 oz), last menstrual period 01/01/2016, SpO2 97 %. General appearance: cooperative Resp: clear to auscultation bilaterally Cardio: regular rate and rhythm GI: soft, incisions CDI, active BS  Disposition: Final discharge disposition not confirmed  Discharge Instructions    Call MD for:  persistant nausea and vomiting   Complete by:  As directed    Call MD for:  redness, tenderness, or signs of infection (pain, swelling, redness, odor or green/yellow discharge around incision site)   Complete by:  As directed    Diet - low sodium heart healthy   Complete by:  As directed    Discharge instructions   Complete by:  As directed    You may shower. No lifting over 10lbs for 6 weeks. Return to work as planned.   Discharge wound care:   Complete by:  As directed    No dressing needed unless there is drainage from one of your wounds.   Increase activity slowly   Complete by:  As directed      Allergies as of 08/20/2017   No Known Allergies     Medication List    STOP taking these medications   hydrOXYzine 25 MG tablet Commonly known as:  ATARAX/VISTARIL   metroNIDAZOLE 500 MG tablet Commonly known as:  FLAGYL   neomycin 500 MG  tablet Commonly known as:  MYCIFRADIN     TAKE these medications   calcium carbonate 500 MG chewable tablet Commonly known as:  TUMS - dosed in mg elemental calcium Chew 2 tablets by mouth daily as needed for indigestion or heartburn.   gabapentin 300 MG capsule Commonly known as:  NEURONTIN Take 1 capsule (300 mg total) 2 (two) times daily by mouth.   hydrocortisone cream 1 % Apply 1 application topically daily. On chin   methocarbamol 500 MG tablet Commonly known as:  ROBAXIN Take 1 tablet (500 mg total) every 6 (six) hours as needed by mouth for muscle spasms.   multivitamin with minerals Tabs tablet Take 2 tablets by mouth daily.   oxyCODONE 5 MG immediate release tablet Commonly known as:  Oxy IR/ROXICODONE Take 1-2 tablets (5-10 mg total) every 4 (four) hours as needed by mouth for moderate pain or severe pain.            Discharge Care Instructions  (From admission, onward)        Start     Ordered   08/20/17 0000  Discharge wound care:    Comments:  No dressing needed unless there is drainage from one of your wounds.   08/20/17 0740     Follow-up Information    Georganna Skeans, MD. Go on 09/12/2017.   Specialty:  General Surgery Why:  as scheduled Contact information: [address] [place] [address] [phone]  SignedGeorganna Skeans E 08/20/2017, 7:41 AM

## 2017-09-12 ENCOUNTER — Other Ambulatory Visit: Payer: Self-pay | Admitting: General Surgery

## 2017-09-27 ENCOUNTER — Ambulatory Visit
Admission: RE | Admit: 2017-09-27 | Discharge: 2017-09-27 | Disposition: A | Discharge status: Home / Self Care | Payer: BLUE CROSS/BLUE SHIELD | Source: Ambulatory Visit | Attending: General Surgery | Admitting: General Surgery

## 2017-09-27 DIAGNOSIS — K769 Liver disease, unspecified: Secondary | ICD-10-CM

## 2017-09-27 DIAGNOSIS — K760 Fatty (change of) liver, not elsewhere classified: Secondary | ICD-10-CM | POA: Diagnosis not present

## 2017-09-27 DIAGNOSIS — D1803 Hemangioma of intra-abdominal structures: Secondary | ICD-10-CM | POA: Diagnosis not present

## 2017-09-27 IMAGING — MR MR ABDOMEN WO/W CM
12 of 17 series · 29 of 48 positions shown · IV contrast (multihance)
Comparison: [DATE] CT abdomen/pelvis.

CLINICAL DATA: History of colonic polypectomy in [REDACTED]
demonstrating tubulovillous adenoma with high-grade dysplasia.
Indeterminate liver lesions on recent CT.

EXAM:
MRI ABDOMEN WITHOUT AND WITH CONTRAST
TECHNIQUE: Multiplanar multisequence MR imaging of the abdomen was performed
both before and after the administration of intravenous contrast.
CONTRAST:  15mL MULTIHANCE GADOBENATE DIMEGLUMINE 529 MG/ML IV SOLN

[Series 3: T2 · axial · 6.5mm · 0.74mm/px · 1 of 28 slices shown (1 of 3)]
[im 1/28]
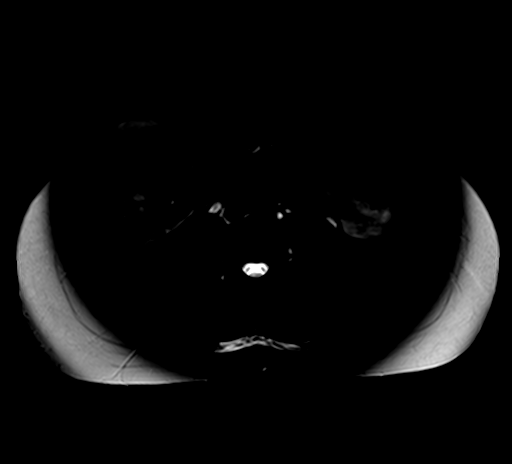

[Series 4: ep2d_diff_b50_500_800_p2 · axial · 6.0mm · 1.98mm/px · z∈[-74,+137]mm · 2 of 84 slices shown]
[im 1/84]
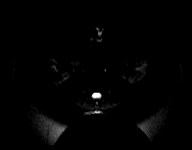
[im 84/84]
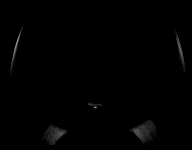

[Series 5: ep2d_[id number]_500_800_p2_adc · axial · 6.0mm · 1.98mm/px · 1 of 28 slices shown]
[im 1/28]
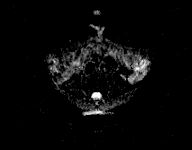

[Series 6: T2 · coronal · 5.0mm · 1.56mm/px · 1 of 27 slices shown (2 of 3)]
[im 1/27]
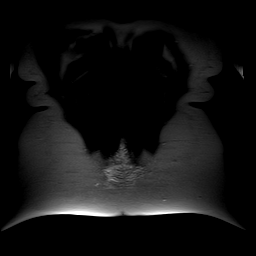

[Series 7: axial tru fisp · axial · 4.0mm · 1.48mm/px · 1 of 37 slices shown]
[im 1/37]
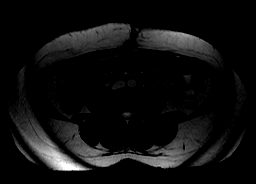

[Series 8: T2 · axial · 4.9mm · 1.37mm/px · 1 of 37 slices shown (3 of 3)]
[im 1/37]
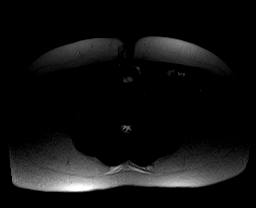

[Series 9: axial in out · axial · 6.0mm · 0.74mm/px · z∈[-68,+125]mm · 2 of 58 slices shown]
[im 1/58]
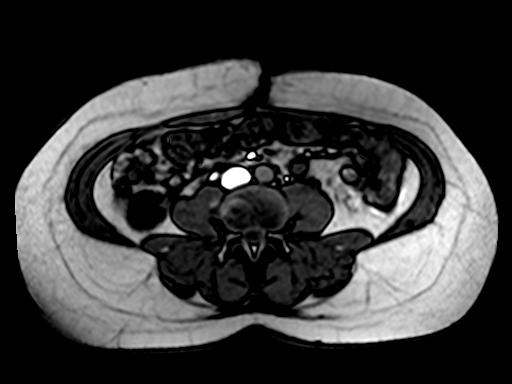
[im 58/58]
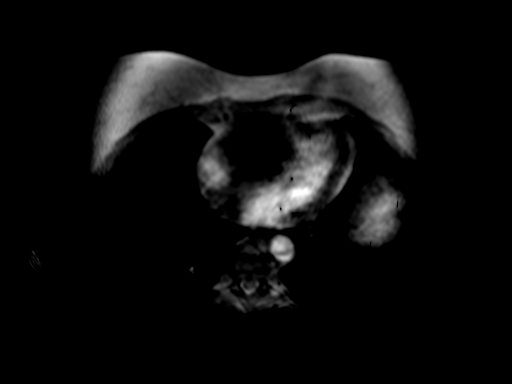

[Series 11: T1 dynamic · axial · non-contrast · 2.0mm · 0.78mm/px · z∈[-58,+132]mm · 4 of 96 slices shown]
[im 1/96]
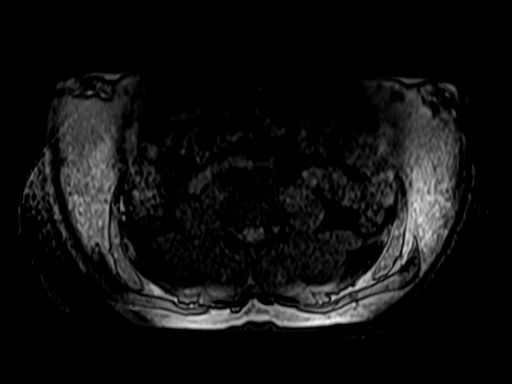
[im 32/96]
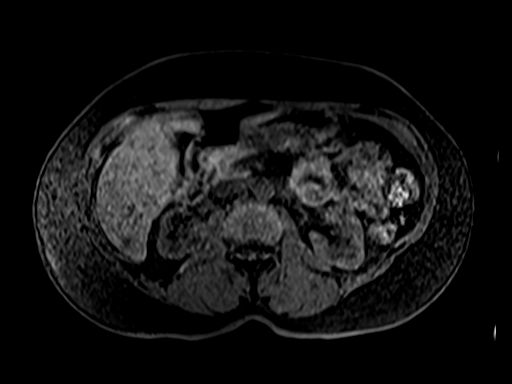
[im 64/96]
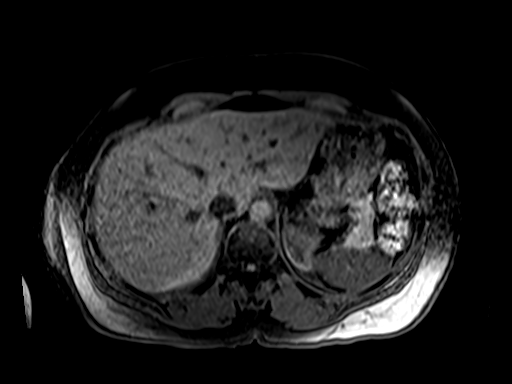
[im 96/96]
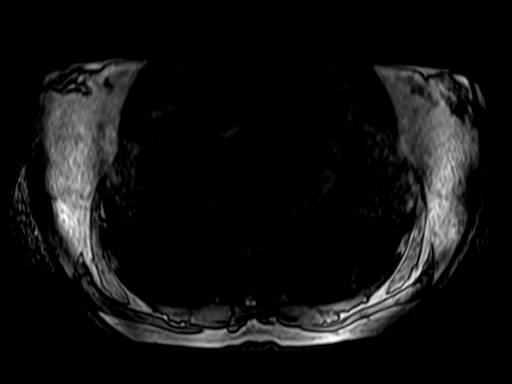

[Series 12: post 25 sec · axial · 2.0mm · 0.78mm/px · z∈[-58,+132]mm · 4 of 96 slices shown]
[im 1/96]
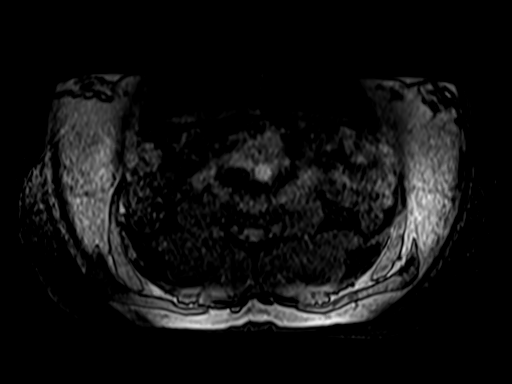
[im 32/96]
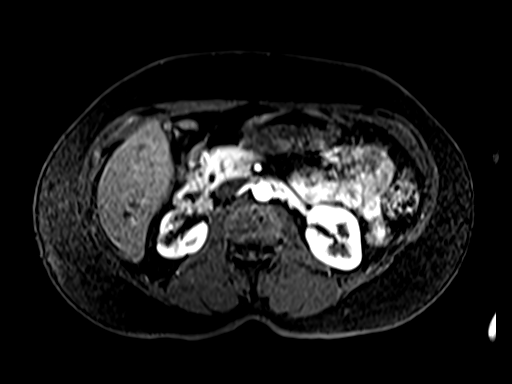
[im 64/96]
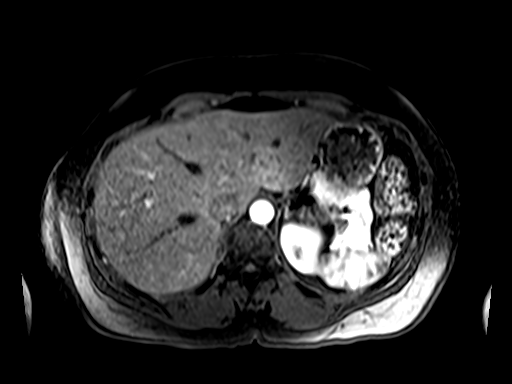
[im 96/96]
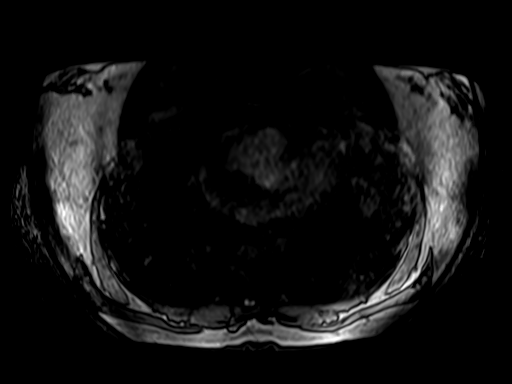

[Series 13: post 25 sec_sub · axial · 2.0mm · 0.78mm/px · z∈[-58,+132]mm · 4 of 96 slices shown]
[im 1/96]
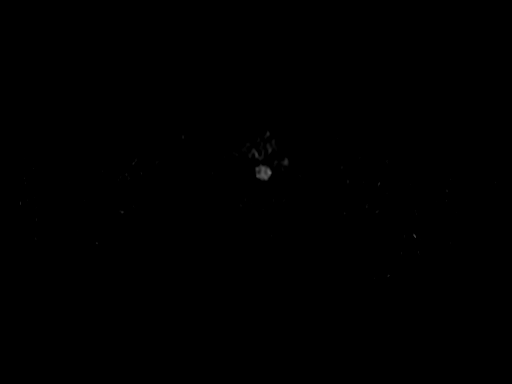
[im 32/96]
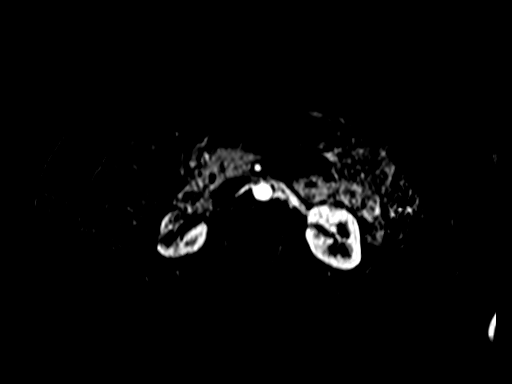
[im 64/96]
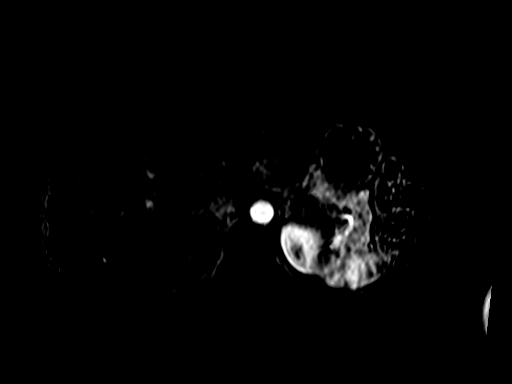
[im 96/96]
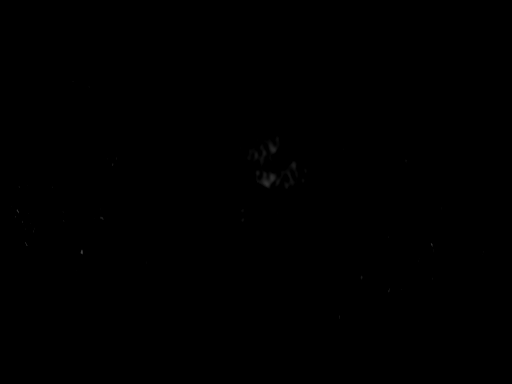

[Series 14: post 45 sec · axial · 2.0mm · 0.78mm/px · z∈[-58,+132]mm · 4 of 96 slices shown]
[im 1/96]
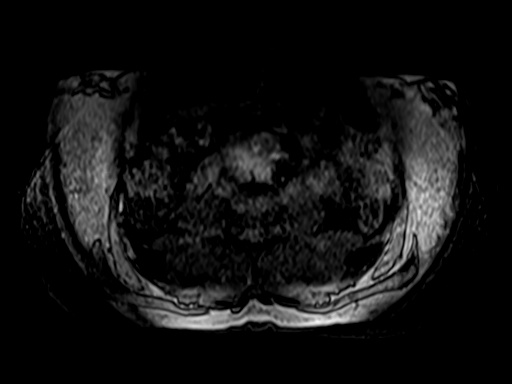
[im 32/96]
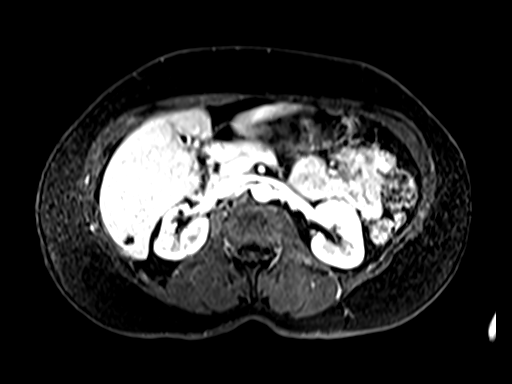
[im 64/96]
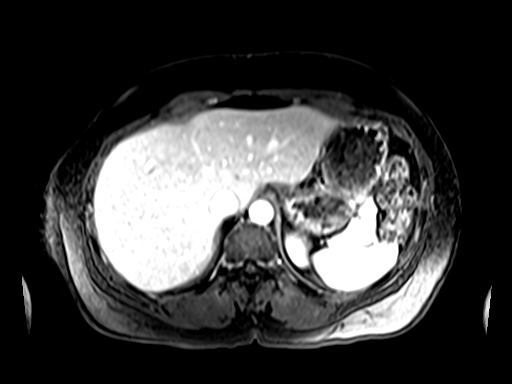
[im 96/96]
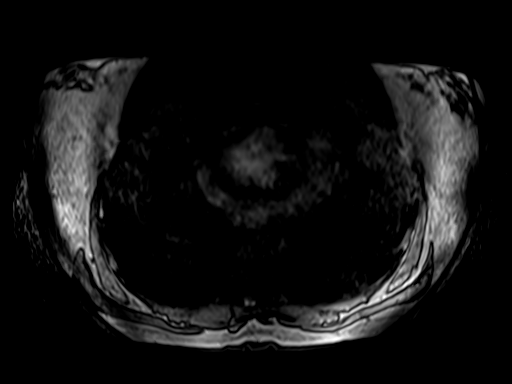

[Series 15: post 45 sec_sub · axial · 2.0mm · 0.78mm/px · z∈[-58,+132]mm · 4 of 96 slices shown]
[im 1/96]
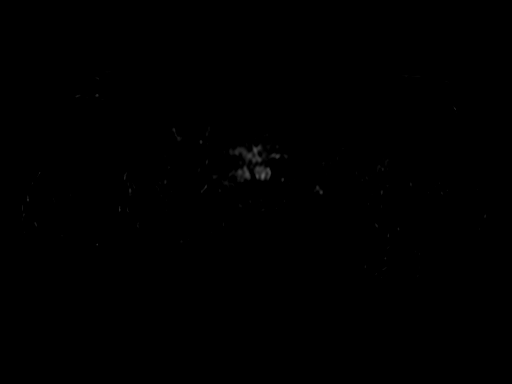
[im 32/96]
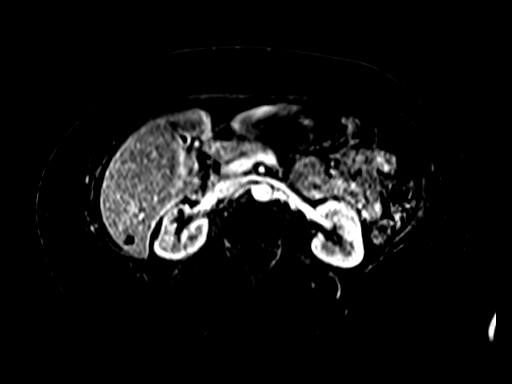
[im 64/96]
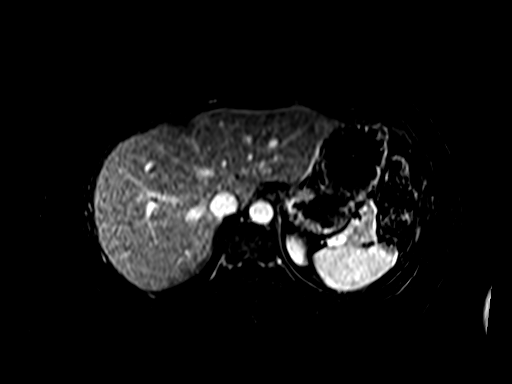
[im 96/96]
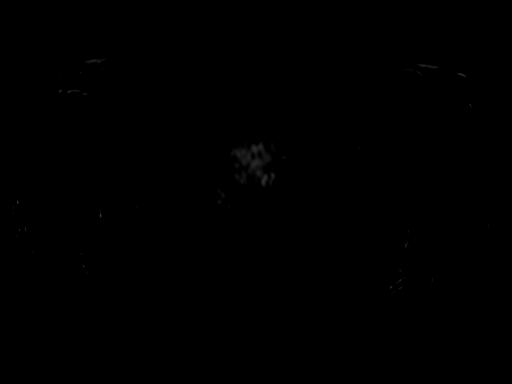

[29 of 48 positions shown; findings below may reference images not displayed]

FINDINGS: Lower chest: No acute abnormality at the lung bases.

Hepatobiliary: Normal liver size and configuration. Mild diffuse
hepatic steatosis. There are 2 small simple posterior right liver
lobe cysts, largest 1.0 cm. There is a 0.9 cm mildly T2 hyperintense
segment 4 left liver lobe mass (series 8/image 16), which
demonstrates progressive peripheral nodular enhancement, compatible
with a benign hemangioma. No additional liver masses. Contracted
gallbladder without cholelithiasis. No biliary ductal dilatation.
Common bile duct diameter 4 mm. No choledocholithiasis.

Pancreas: No pancreatic mass or duct dilation.  No pancreas divisum.

Spleen: Normal size. No mass.

Adrenals/Urinary Tract: Normal adrenals. Stable asymmetric severe
right renal atrophy and scarring. No hydronephrosis. Several simple
subcentimeter renal cysts scattered in the right kidney. No
suspicious renal masses.

Stomach/Bowel: Grossly normal stomach. Visualized small and large
bowel is normal caliber, with no bowel wall thickening.

Vascular/Lymphatic: Normal caliber abdominal aorta. Patent portal,
splenic, hepatic and renal veins. No pathologically enlarged lymph
nodes in the abdomen.

Other: No abdominal ascites or focal fluid collection. Partially
visualized enlarged myomatous uterus, not well evaluated on this
study.

Musculoskeletal: No aggressive appearing focal osseous lesions.
IMPRESSION: 1. No suspicious liver masses. Small simple liver cysts and liver
hemangioma.
2. Mild diffuse hepatic steatosis.
3. Asymmetric severe right renal atrophy and scarring.

## 2017-09-27 MED ORDER — GADOBENATE DIMEGLUMINE 529 MG/ML IV SOLN
15.0000 mL | Freq: Once | INTRAVENOUS | Status: AC | PRN
  Administered 2017-09-27: 15 mL via INTRAVENOUS

## 2018-01-05 DIAGNOSIS — Z021 Encounter for pre-employment examination: Secondary | ICD-10-CM | POA: Diagnosis not present

## 2018-10-10 DIAGNOSIS — Z124 Encounter for screening for malignant neoplasm of cervix: Secondary | ICD-10-CM | POA: Diagnosis not present

## 2018-10-10 DIAGNOSIS — Z01419 Encounter for gynecological examination (general) (routine) without abnormal findings: Secondary | ICD-10-CM | POA: Diagnosis not present

## 2018-10-10 DIAGNOSIS — Z113 Encounter for screening for infections with a predominantly sexual mode of transmission: Secondary | ICD-10-CM | POA: Diagnosis not present

## 2018-10-10 DIAGNOSIS — Z1151 Encounter for screening for human papillomavirus (HPV): Secondary | ICD-10-CM | POA: Diagnosis not present

## 2018-10-10 DIAGNOSIS — Z6829 Body mass index (BMI) 29.0-29.9, adult: Secondary | ICD-10-CM | POA: Diagnosis not present

## 2018-10-10 DIAGNOSIS — Z1231 Encounter for screening mammogram for malignant neoplasm of breast: Secondary | ICD-10-CM | POA: Diagnosis not present

## 2018-10-10 LAB — HM MAMMOGRAPHY

## 2018-10-10 LAB — HM PAP SMEAR

## 2018-10-15 DIAGNOSIS — N950 Postmenopausal bleeding: Secondary | ICD-10-CM | POA: Diagnosis not present

## 2018-11-08 DIAGNOSIS — Z86010 Personal history of colonic polyps: Secondary | ICD-10-CM | POA: Diagnosis not present

## 2018-11-08 DIAGNOSIS — Z980 Intestinal bypass and anastomosis status: Secondary | ICD-10-CM | POA: Diagnosis not present

## 2018-11-08 DIAGNOSIS — K5730 Diverticulosis of large intestine without perforation or abscess without bleeding: Secondary | ICD-10-CM | POA: Diagnosis not present

## 2018-11-08 LAB — HM COLONOSCOPY

## 2018-11-10 DIAGNOSIS — J101 Influenza due to other identified influenza virus with other respiratory manifestations: Secondary | ICD-10-CM | POA: Diagnosis not present

## 2018-11-14 DIAGNOSIS — K5790 Diverticulosis of intestine, part unspecified, without perforation or abscess without bleeding: Secondary | ICD-10-CM | POA: Insufficient documentation

## 2018-11-15 ENCOUNTER — Encounter: Payer: Self-pay | Admitting: Family Medicine

## 2019-12-29 ENCOUNTER — Ambulatory Visit (INDEPENDENT_AMBULATORY_CARE_PROVIDER_SITE_OTHER): Payer: BC Managed Care – PPO | Admitting: Family Medicine

## 2019-12-29 ENCOUNTER — Encounter: Payer: Self-pay | Admitting: Family Medicine

## 2019-12-29 ENCOUNTER — Other Ambulatory Visit: Payer: Self-pay

## 2019-12-29 VITALS — BP 112/78 | HR 86 | Temp 97.5°F | Wt 180.0 lb

## 2019-12-29 DIAGNOSIS — D126 Benign neoplasm of colon, unspecified: Secondary | ICD-10-CM | POA: Diagnosis not present

## 2019-12-29 DIAGNOSIS — Z1159 Encounter for screening for other viral diseases: Secondary | ICD-10-CM | POA: Diagnosis not present

## 2019-12-29 DIAGNOSIS — K5790 Diverticulosis of intestine, part unspecified, without perforation or abscess without bleeding: Secondary | ICD-10-CM | POA: Diagnosis not present

## 2019-12-29 DIAGNOSIS — M461 Sacroiliitis, not elsewhere classified: Secondary | ICD-10-CM

## 2019-12-29 DIAGNOSIS — Z13220 Encounter for screening for lipoid disorders: Secondary | ICD-10-CM

## 2019-12-29 DIAGNOSIS — E559 Vitamin D deficiency, unspecified: Secondary | ICD-10-CM | POA: Diagnosis not present

## 2019-12-29 DIAGNOSIS — Z9049 Acquired absence of other specified parts of digestive tract: Secondary | ICD-10-CM

## 2019-12-29 DIAGNOSIS — Z23 Encounter for immunization: Secondary | ICD-10-CM | POA: Diagnosis not present

## 2019-12-29 NOTE — Progress Notes (Signed)
   Subjective:    Patient ID: Marie Vazquez, female    DOB: Dec 09, 1960, 59 y.o.   MRN: [id number]  HPI She is here for evaluation of a 1 year history of right-sided low back pain.  It usually bothers her mainly when she lies down at night.  She can do her daily activities without problems until this weekend when she noted difficulty with the right sided low back pain with walking.  No numbness, tingling or weakness. Review of her record indicates she does have a vitamin D deficiency that needs follow-up on.  She also has had a partial colectomy for an adenomatous polyp and is scheduled for repeat study later this year.  There is also history of diverticulosis.  Review of Systems     Objective:   Physical Exam Alert and in no distress.  Slight tenderness palpation over the right SI joint.  Figure-of-four and stork test was positive.  Full motion of the hip.  Negative straight leg raising. Review of record indicates need for hepatitis C screening as well as routine blood work.       Assessment & Plan:  Sacroiliitis ([place])  S/P laparoscopic colectomy  Need for Tdap vaccination - Plan: Tdap vaccine greater than or equal to 7yo IM  Diverticulosis  Vitamin D deficiency - Plan: VITAMIN D 25 Hydroxy (Vit-D Deficiency, Fractures)  Encounter for hepatitis C screening test for low risk patient - Plan: Hepatitis C antibody  Adenomatous polyp of colon, unspecified part of colon - Plan: CBC with Differential/Platelet, Comprehensive metabolic panel  Screening for lipid disorders - Plan: Lipid panel I instructed her on proper care for the sacroiliitis with stretching exercises and heat.  If she continues have difficulty she will call for referral for physical therapy.  She is to follow-up with gastroenterology concerning her adenomatous polyp.  Her immunizations were updated.

## 2019-12-29 NOTE — Patient Instructions (Signed)
Heat for 20 minutes 3 times per day.  Stretching after the heat.  You can take either Tylenol o

## 2019-12-31 ENCOUNTER — Telehealth: Payer: Self-pay

## 2019-12-31 LAB — COMPREHENSIVE METABOLIC PANEL
ALT: 17 IU/L (ref 0–32)
AST: 19 IU/L (ref 0–40)
Albumin/Globulin Ratio: 1.5 (ref 1.2–2.2)
Albumin: 4.6 g/dL (ref 3.8–4.9)
Alkaline Phosphatase: 109 IU/L (ref 39–117)
BUN/Creatinine Ratio: 15 (ref 9–23)
BUN: 15 mg/dL (ref 6–24)
Bilirubin Total: 0.4 mg/dL (ref 0.0–1.2)
CO2: 26 mmol/L (ref 20–29)
Calcium: 10 mg/dL (ref 8.7–10.2)
Chloride: 101 mmol/L (ref 96–106)
Creatinine, Ser: 1.03 mg/dL — ABNORMAL HIGH (ref 0.57–1.00)
GFR calc Af Amer: 69 mL/min/{1.73_m2} (ref >59)
GFR calc non Af Amer: 60 mL/min/{1.73_m2} (ref >59)
Globulin, Total: 3 g/dL (ref 1.5–4.5)
Glucose: 87 mg/dL (ref 65–99)
Potassium: 4.2 mmol/L (ref 3.5–5.2)
Sodium: 141 mmol/L (ref 134–144)
Total Protein: 7.6 g/dL (ref 6.0–8.5)

## 2019-12-31 LAB — CBC WITH DIFFERENTIAL/PLATELET
Basophils Absolute: 0 10*3/uL (ref 0.0–0.2)
Basos: 1 %
EOS (ABSOLUTE): 0.1 10*3/uL (ref 0.0–0.4)
Eos: 1 %
Hematocrit: 41.7 % (ref 34.0–46.6)
Hemoglobin: 14.4 g/dL (ref 11.1–15.9)
Immature Grans (Abs): 0 10*3/uL (ref 0.0–0.1)
Immature Granulocytes: 0 %
Lymphocytes Absolute: 1.7 10*3/uL (ref 0.7–3.1)
Lymphs: 32 %
MCH: 31.9 pg (ref 26.6–33.0)
MCHC: 34.5 g/dL (ref 31.5–35.7)
MCV: 92 fL (ref 79–97)
Monocytes Absolute: 0.4 10*3/uL (ref 0.1–0.9)
Monocytes: 7 %
Neutrophils Absolute: 3 10*3/uL (ref 1.4–7.0)
Neutrophils: 59 %
Platelets: 164 10*3/uL (ref 150–450)
RBC: 4.52 x10E6/uL (ref 3.77–5.28)
RDW: 11.8 % (ref 11.7–15.4)
WBC: 5.2 10*3/uL (ref 3.4–10.8)

## 2019-12-31 LAB — LIPID PANEL
Chol/HDL Ratio: 2.6 ratio (ref 0.0–4.4)
Cholesterol, Total: 162 mg/dL (ref 100–199)
HDL: 63 mg/dL (ref >39)
LDL Chol Calc (NIH): 81 mg/dL (ref 0–99)
Triglycerides: 97 mg/dL (ref 0–149)
VLDL Cholesterol Cal: 18 mg/dL (ref 5–40)

## 2019-12-31 LAB — HEPATITIS C ANTIBODY: Hep C Virus Ab: <0.1 s/co ratio (ref 0.0–0.9)

## 2019-12-31 LAB — VITAMIN D 25 HYDROXY (VIT D DEFICIENCY, FRACTURES): Vit D, 25-Hydroxy: 28.1 ng/mL — ABNORMAL LOW (ref 30.0–100.0)

## 2019-12-31 NOTE — Telephone Encounter (Signed)
Pt labs has not resulted due to [facility]. Machine being done  Per Verdis Frederickson. Please advise . [place]

## 2020-01-16 DIAGNOSIS — H18891 Other specified disorders of cornea, right eye: Secondary | ICD-10-CM | POA: Diagnosis not present

## 2020-03-01 ENCOUNTER — Other Ambulatory Visit (INDEPENDENT_AMBULATORY_CARE_PROVIDER_SITE_OTHER): Payer: BC Managed Care – PPO

## 2020-03-01 ENCOUNTER — Other Ambulatory Visit: Payer: Self-pay

## 2020-03-01 DIAGNOSIS — Z23 Encounter for immunization: Secondary | ICD-10-CM | POA: Diagnosis not present

## 2020-06-07 ENCOUNTER — Ambulatory Visit
Admission: RE | Admit: 2020-06-07 | Discharge: 2020-06-07 | Disposition: A | Discharge status: Home / Self Care | Payer: BC Managed Care – PPO | Source: Ambulatory Visit | Attending: Family Medicine | Admitting: Family Medicine

## 2020-06-07 ENCOUNTER — Other Ambulatory Visit: Payer: Self-pay

## 2020-06-07 ENCOUNTER — Encounter: Payer: Self-pay | Admitting: Family Medicine

## 2020-06-07 ENCOUNTER — Ambulatory Visit (INDEPENDENT_AMBULATORY_CARE_PROVIDER_SITE_OTHER): Payer: BC Managed Care – PPO | Admitting: Family Medicine

## 2020-06-07 VITALS — BP 128/82 | HR 70 | Temp 98.2°F | Wt 181.8 lb

## 2020-06-07 DIAGNOSIS — M461 Sacroiliitis, not elsewhere classified: Secondary | ICD-10-CM

## 2020-06-07 DIAGNOSIS — Z23 Encounter for immunization: Secondary | ICD-10-CM

## 2020-06-07 DIAGNOSIS — M47816 Spondylosis without myelopathy or radiculopathy, lumbar region: Secondary | ICD-10-CM | POA: Diagnosis not present

## 2020-06-07 NOTE — Progress Notes (Signed)
   Subjective:    Patient ID: Marie Vazquez, female    DOB: 1961-01-10, 59 y.o.   MRN: [id number]  HPI She continues to complain of right-sided low back pain.  She was seen in March and instructed to repeat on stretching exercises but continues have trouble.  She has no other bone or joint complaints, numbness, tingling or weakness in her legs.  She is scheduled for a mammogram. Also offered flu shot.  Review of Systems     Objective:   Physical Exam Alert and in no distress.  Slight tenderness palpation over the right SI joint area.  No pain over the sciatic notch.  Normal lumbar motion.  Normal hip motion.  DTRs normal.  Negative straight leg raising.       Assessment & Plan:  Sacroiliitis ([place]) - Plan: DG Lumbar Spine Complete  Need for influenza vaccination - Plan: Flu Vaccine QUAD 6+ mos PF IM (Fluarix Quad PF) Follow-up after x-rays.  Discussed possible physical therapy referral.  Also discussed possible use of chiropractic manipulation.

## 2020-06-08 ENCOUNTER — Other Ambulatory Visit: Payer: Self-pay

## 2020-06-08 DIAGNOSIS — M461 Sacroiliitis, not elsewhere classified: Secondary | ICD-10-CM

## 2020-06-09 ENCOUNTER — Encounter: Payer: Self-pay | Admitting: Rehabilitative and Restorative Service Providers"

## 2020-06-09 ENCOUNTER — Ambulatory Visit (INDEPENDENT_AMBULATORY_CARE_PROVIDER_SITE_OTHER): Payer: BC Managed Care – PPO | Admitting: Rehabilitative and Restorative Service Providers"

## 2020-06-09 ENCOUNTER — Other Ambulatory Visit: Payer: Self-pay

## 2020-06-09 DIAGNOSIS — G8929 Other chronic pain: Secondary | ICD-10-CM

## 2020-06-09 DIAGNOSIS — M25551 Pain in right hip: Secondary | ICD-10-CM | POA: Diagnosis not present

## 2020-06-09 DIAGNOSIS — R293 Abnormal posture: Secondary | ICD-10-CM | POA: Diagnosis not present

## 2020-06-09 DIAGNOSIS — M545 Low back pain: Secondary | ICD-10-CM | POA: Diagnosis not present

## 2020-06-09 DIAGNOSIS — M6281 Muscle weakness (generalized): Secondary | ICD-10-CM | POA: Diagnosis not present

## 2020-06-09 NOTE — Patient Instructions (Signed)
Access Code: [email or web address] URL: [email or web address] Date: 06/09/2020 Prepared by: Scot Jun  Exercises Sidelying Lumbar Rotation Stretch - 2 x daily - 7 x weekly - 1 sets - 5 reps - 15 hold Supine Posterior Pelvic Tilt - 2 x daily - 7 x weekly - 1 sets - 10 reps - 5 hold Supine March with Posterior Pelvic Tilt - 2 x daily - 7 x weekly - 10 reps - 3 sets Supine Bridge - 2 x daily - 7 x weekly - 10 reps - 3 sets - 2 hold

## 2020-06-09 NOTE — Therapy (Signed)
[facility] [address] [place], [place], [address] Phone: [phone]   Fax:  [phone]  Physical Therapy Evaluation  Patient Details  Name: Marie Vazquez MRN: [id number] Date of Birth: 1961-05-29 Referring Provider (PT): Dr. Jill Alexanders   Encounter Date: 06/09/2020   PT End of Session - 06/09/20 1513    Visit Number 1    Number of Visits 12    Date for PT Re-Evaluation 08/04/20    Progress Note Due on Visit 10    PT Start Time 1515    PT Stop Time 1550    PT Time Calculation (min) 35 min    Activity Tolerance Patient tolerated treatment well    Behavior During Therapy [facility] for tasks assessed/performed           Past Medical History:  Diagnosis Date  . Chronic tension headaches   . Contraception   . Fibroids   . GERD (gastroesophageal reflux disease)    "sometimes"  . Panic attacks   . Tubulovillous adenoma of colon with high grade dysplasia 06/22/2017   Repeat colonoscopy in 1 yr  . Vitamin D deficiency     Past Surgical History:  Procedure Laterality Date  . COLON SURGERY Right 08/16/2017   lap assisted right colectomy  . LAPAROSCOPIC RIGHT COLECTOMY Right 08/16/2017   Procedure: LAPAROSCOPIC ASSISTED  RIGHT COLECTOMY ERAS PATHWAY;  Surgeon: Georganna Skeans, MD;  Location: [place];  Service: General;  Laterality: Right;  . MYOMECTOMY    . TONSILLECTOMY      There were no vitals filed for this visit.    Subjective Assessment - 06/09/20 1518    Subjective Pt. comes to clinic c chief complaint of Rt posterior hip/lower back pain, noted c getting up and moving, as well as at night as well.  Pt. stated mostly throughout the day is doing ok.   Pt. stated off and on trouble in last year and a half or so for back/hip.    Patient Stated Goals Reduce pain    Currently in Pain? Yes    Pain Score 0-No pain   at worst 5/10   Pain Location Back   Rt low back/hip   Pain Orientation Right    Pain Type Chronic pain    Aggravating  Factors  aching c sitting at times, lying down, transfers at times, prolonged standing, sweeping    Pain Relieving Factors some stretching, heating pad              [facility] PT Assessment - 06/09/20 0001      Assessment   Medical Diagnosis Sacroiliitis    Referring Provider (PT) Dr. Jill Alexanders    Onset Date/Surgical Date 10/16/18    Hand Dominance Right      Precautions   Precautions None      Restrictions   Weight Bearing Restrictions No      Balance Screen   Has the patient fallen in the past 6 months No      [place] residence    Home Access Stairs to enter    Entrance Stairs-Number of Steps 3    Entrance Stairs-Rails Can reach both    [place] One level      Prior Function   Level of Independence Independent    Vocation Requirements Desk work full time    Leisure Read, house/yard work      Cognition   Overall Cognitive Status Within Functional Limits for tasks assessed  Sensation   Light Touch Appears Intact      Posture/Postural Control   Posture Comments Increased lower lumbar lordosis in standing      ROM / Strength   AROM / PROM / Strength Strength;PROM;AROM      AROM   AROM Assessment Site Lumbar;Hip    Right/Left Hip Left;Right    Lumbar Flexion to ankles no complaint    Lumbar Extension 50% ERP lumbar, REIS x 5 same ERP.  Hinge at top of lumbar, minimal posterior pevlic tilt in movement    Lumbar - Right Side Bend Rt knee jt no complaint    Lumbar - Left Side Bend Lt knee jt no complaint    Lumbar - Right Rotation Limited and painful compared to Lt      Strength   Overall Strength Comments Increased anterior tilt lumbar c hip extension activation in prone, poor core control    Strength Assessment Site Hip;Knee;Ankle    Right/Left Hip Left;Right    Right Hip Flexion 5/5    Right Hip Extension 4/5    Left Hip Flexion 5/5    Right/Left Knee Left;Right    Right Knee Flexion 5/5    Right Knee Extension  5/5    Left Knee Flexion 5/5    Left Knee Extension 5/5    Right/Left Ankle Left;Right    Right Ankle Dorsiflexion 5/5    Left Ankle Dorsiflexion 5/5      Flexibility   Soft Tissue Assessment /Muscle Length yes    Hamstrings passive SLR Rt and Lt to 70 deg, no concordant pain,      Palpation   Palpation comment No myofascial tenderness noted to touch, mild localized L4 symptoms c cPA.  Mobility WFL in L1-L4, mild limitation L5      Special Tests   Other special tests (-) Slump bilateral, (-) crossed SLR bilateral                      Objective measurements completed on examination: See above findings.       [place] Adult PT Treatment/Exercise - 06/09/20 0001      Exercises   Exercises Lumbar      Lumbar Exercises: Stretches   Other Lumbar Stretch Exercise sidelying regional rotation stretch 15 sec x 5       Lumbar Exercises: Supine   Bridge Compliant;10 reps    Other Supine Lumbar Exercises supine pelvic tilt 5 sec hold x 10, supine marching c pelvic tilt focus x 10 each LE                  PT Education - 06/09/20 1514    Education Details HEP, POC    Person(s) Educated Patient    Methods Explanation;Demonstration;Verbal cues;Handout    Comprehension Verbalized understanding;Returned demonstration            PT Short Term Goals - 06/09/20 1513      PT SHORT TERM GOAL #1   Title Patient will demonstrate independent use of home exercise program to maintain progress from in clinic treatments.    Time 2    Period Weeks    Status New    Target Date 06/23/20             PT Long Term Goals - 06/09/20 1513      PT LONG TERM GOAL #1   Title Patient will demonstrate/report pain at worst less than or equal to 2/10 to facilitate minimal limitation in  daily activity secondary to pain symptoms.    Time 8    Period Weeks    Status New    Target Date 08/04/20      PT LONG TERM GOAL #2   Title Patient will demonstrate independent use of home  exercise program to facilitate ability to maintain/progress functional gains from skilled physical therapy services.    Time 8    Period Weeks    Status New    Target Date 08/04/20      PT LONG TERM GOAL #3   Title Pt. will demonstrate lumbar ext mobility 100 % WFL s postural deviations to facilitate full range, standing posture at PLOF s limitation.    Time 8    Period Weeks    Status New    Target Date 08/04/20      PT LONG TERM GOAL #4   Title Pt. will demonstrate/report ability to sleep unrestricted due to symptoms.    Time 8    Period Weeks    Status New    Target Date 08/04/20      PT LONG TERM GOAL #5   Title Pt. will demonstrate bilateral hip extension 5/5 s pelvic tilt deviation for improved movmeent coordination in daily activity/mobility.    Time 8    Period Weeks    Status New    Target Date 08/04/20                  Plan - 06/09/20 1512    Clinical Impression Statement Patient is a 59 y.o. female who comes to clinic with complaints of low back/Rt posteroir hip pain with mobility, strength and movement coordination deficits that impair their ability to perform usual daily and recreational functional activities without increase difficulty/symptoms at this time.  Patient to benefit from skilled PT services to address impairments and limitations to improve to previous level of function without restriction secondary to condition.    Personal Factors and Comorbidities Other   GERD, chronic tension headaches   Examination-Activity Limitations Sleep;Sit;Stand    Examination-Participation Restrictions Other   sleep, housework   Stability/Clinical Decision Making Stable/Uncomplicated    Clinical Decision Making Low    Rehab Potential Good    PT Frequency --   1-2x/week   PT Duration 8 weeks    PT Treatment/Interventions ADLs/Self Care Home Management;Electrical Stimulation;Cryotherapy;Iontophoresis 4mg /ml Dexamethasone;Moist Heat;Traction;Balance training;Therapeutic  exercise;Therapeutic activities;Functional mobility training;Gait training;Stair training;Ultrasound;Neuromuscular re-education;Patient/family education;Manual techniques;Taping;Dry needling;Passive range of motion;Spinal Manipulations;Joint Manipulations    PT Next Visit Plan Reassess HEP, lumbar manip, pelvic stability c LE movement.    PT Home Exercise Plan [id number]    Consulted and Agree with Plan of Care Patient           Patient will benefit from skilled therapeutic intervention in order to improve the following deficits and impairments:     Visit Diagnosis: Chronic right-sided low back pain without sciatica  Pain in right hip  Muscle weakness (generalized)  Abnormal posture     Problem List Patient Active Problem List   Diagnosis Date Noted  . Diverticulosis 11/14/2018  . S/P laparoscopic colectomy 08/16/2017  . Vitamin D deficiency 09/25/2011    Scot Jun, PT, DPT, OCS, ATC 06/09/20  4:00 PM    [facility] [address] [place], [place], [address] Phone: [phone]   Fax:  [phone]  Name: Marie Vazquez MRN: [id number] Date of Birth: 1961/04/17

## 2020-06-25 ENCOUNTER — Encounter: Payer: Self-pay | Admitting: Rehabilitative and Restorative Service Providers"

## 2020-06-25 ENCOUNTER — Ambulatory Visit (INDEPENDENT_AMBULATORY_CARE_PROVIDER_SITE_OTHER): Payer: Self-pay | Admitting: Rehabilitative and Restorative Service Providers"

## 2020-06-25 ENCOUNTER — Other Ambulatory Visit: Payer: Self-pay

## 2020-06-25 DIAGNOSIS — M6281 Muscle weakness (generalized): Secondary | ICD-10-CM

## 2020-06-25 DIAGNOSIS — M545 Low back pain: Secondary | ICD-10-CM

## 2020-06-25 DIAGNOSIS — M25551 Pain in right hip: Secondary | ICD-10-CM

## 2020-06-25 DIAGNOSIS — R293 Abnormal posture: Secondary | ICD-10-CM

## 2020-06-25 DIAGNOSIS — G8929 Other chronic pain: Secondary | ICD-10-CM

## 2020-06-25 NOTE — Therapy (Signed)
[facility] [address] [place], [place], [address] Phone: [phone]   Fax:  [phone]  Physical Therapy Treatment  Patient Details  Name: Marie Vazquez MRN: [id number] Date of Birth: 09-26-1961 Referring Provider (PT): Dr. Jill Alexanders   Encounter Date: 06/25/2020   PT End of Session - 06/25/20 0853    Visit Number 2    Number of Visits 12    Date for PT Re-Evaluation 08/04/20    Progress Note Due on Visit 10    PT Start Time 0845    PT Stop Time 0924    PT Time Calculation (min) 39 min    Activity Tolerance Patient tolerated treatment well    Behavior During Therapy [facility] for tasks assessed/performed           Past Medical History:  Diagnosis Date   Chronic tension headaches    Contraception    Fibroids    GERD (gastroesophageal reflux disease)    "sometimes"   Panic attacks    Tubulovillous adenoma of colon with high grade dysplasia 06/22/2017   Repeat colonoscopy in 1 yr   Vitamin D deficiency     Past Surgical History:  Procedure Laterality Date   COLON SURGERY Right 08/16/2017   lap assisted right colectomy   LAPAROSCOPIC RIGHT COLECTOMY Right 08/16/2017   Procedure: LAPAROSCOPIC ASSISTED  RIGHT [place];  Surgeon: Georganna Skeans, MD;  Location: [place];  Service: General;  Laterality: Right;   MYOMECTOMY     TONSILLECTOMY      There were no vitals filed for this visit.   Subjective Assessment - 06/25/20 0845    Subjective Pt. indicated feeling improvement in symptoms when lying down.  Pt. stated she noticed some complaints c walking on Rt side.    Patient Stated Goals Reduce pain    Currently in Pain? Yes    Pain Score 1     Pain Location Back    Pain Orientation Right    Pain Descriptors / Indicators Other (Comment)   catch   Pain Type Chronic pain    Pain Onset More than a month ago    Aggravating Factors  walking    Pain Relieving Factors improved c some exercise in HEP    Effect  of Pain on Daily Activities Walking                             [facility] Adult PT Treatment/Exercise - 06/25/20 0001      Lumbar Exercises: Stretches   Lower Trunk Rotation 5 reps;Other (comment)   15 sec x 5   Other Lumbar Stretch Exercise sidelying regional rotation stretch 15 sec x 5 bilateral      Lumbar Exercises: Aerobic   Nustep Lvl 6 12 mins      Lumbar Exercises: Supine   Bridge Compliant   2 x 10   Other Supine Lumbar Exercises supine pelvic tilt posterior c marching 2 x 10, pball press down 10 sec x 10      Manual Therapy   Manual therapy comments Lt sidelying regional rotation mobiliations g3                     PT Short Term Goals - 06/25/20 3825      PT SHORT TERM GOAL #1   Title Patient will demonstrate independent use of home exercise program to maintain progress from in clinic treatments.    Time 2  Period Weeks    Status Partially Met    Target Date 06/23/20             PT Long Term Goals - 06/25/20 0852      PT LONG TERM GOAL #1   Title Patient will demonstrate/report pain at worst less than or equal to 2/10 to facilitate minimal limitation in daily activity secondary to pain symptoms.    Time 8    Period Weeks    Status On-going    Target Date 08/04/20      PT LONG TERM GOAL #2   Title Patient will demonstrate independent use of home exercise program to facilitate ability to maintain/progress functional gains from skilled physical therapy services.    Time 8    Period Weeks    Status Partially Met    Target Date 08/04/20      PT LONG TERM GOAL #3   Title Pt. will demonstrate lumbar ext mobility 100 % WFL s postural deviations to facilitate full range, standing posture at PLOF s limitation.    Time 8    Period Weeks    Status On-going    Target Date 08/04/20      PT LONG TERM GOAL #4   Title Pt. will demonstrate/report ability to sleep unrestricted due to symptoms.    Time 8    Period Weeks    Status Achieved     Target Date 08/04/20      PT LONG TERM GOAL #5   Title Pt. will demonstrate bilateral hip extension 5/5 s pelvic tilt deviation for improved movmeent coordination in daily activity/mobility.    Time 8    Period Weeks    Status On-going    Target Date 08/04/20                 Plan - 06/25/20 0854    Clinical Impression Statement Initial response of improved bed mobility/sleep was positive.  Mild complaints at end range lumbosacral rotation to Rt observed in clinic today.  Cues for HEP required .    Personal Factors and Comorbidities Other   GERD, chronic tension headaches   Examination-Activity Limitations Sleep;Sit;Stand    Examination-Participation Restrictions Other   sleep, housework   Stability/Clinical Decision Making Stable/Uncomplicated    Rehab Potential Good    PT Frequency --   1-2x/week   PT Duration 8 weeks    PT Treatment/Interventions ADLs/Self Care Home Management;Electrical Stimulation;Cryotherapy;Iontophoresis 51m/ml Dexamethasone;Moist Heat;Traction;Balance training;Therapeutic exercise;Therapeutic activities;Functional mobility training;Gait training;Stair training;Ultrasound;Neuromuscular re-education;Patient/family education;Manual techniques;Taping;Dry needling;Passive range of motion;Spinal Manipulations;Joint Manipulations    PT Next Visit Plan lumbar manip, pelvic stability c LE movement.    PT Home Exercise Plan [id number]    Consulted and Agree with Plan of Care Patient           Patient will benefit from skilled therapeutic intervention in order to improve the following deficits and impairments:     Visit Diagnosis: Chronic right-sided low back pain without sciatica  Pain in right hip  Muscle weakness (generalized)  Abnormal posture     Problem List Patient Active Problem List   Diagnosis Date Noted   Diverticulosis 11/14/2018   S/P laparoscopic colectomy 08/16/2017   Vitamin D deficiency 09/25/2011   MScot Jun PT, DPT,  OCS, ATC 06/25/20  9:12 AM    [facility] [address] [address]Phone: [phone]  Fax:  [phone] Name: STamsin NaderMRN: [id number] of Birth: [birth year]

## 2020-06-29 ENCOUNTER — Encounter: Payer: BC Managed Care – PPO | Admitting: Rehabilitative and Restorative Service Providers"

## 2020-07-07 ENCOUNTER — Encounter: Payer: Self-pay | Admitting: Rehabilitative and Restorative Service Providers"

## 2020-07-07 ENCOUNTER — Other Ambulatory Visit: Payer: Self-pay

## 2020-07-07 ENCOUNTER — Ambulatory Visit (INDEPENDENT_AMBULATORY_CARE_PROVIDER_SITE_OTHER): Payer: BC Managed Care – PPO | Admitting: Rehabilitative and Restorative Service Providers"

## 2020-07-07 DIAGNOSIS — M545 Low back pain: Secondary | ICD-10-CM

## 2020-07-07 DIAGNOSIS — M25551 Pain in right hip: Secondary | ICD-10-CM

## 2020-07-07 DIAGNOSIS — G8929 Other chronic pain: Secondary | ICD-10-CM

## 2020-07-07 DIAGNOSIS — R293 Abnormal posture: Secondary | ICD-10-CM | POA: Diagnosis not present

## 2020-07-07 DIAGNOSIS — M6281 Muscle weakness (generalized): Secondary | ICD-10-CM | POA: Diagnosis not present

## 2020-07-07 NOTE — Therapy (Signed)
[facility] Physical Therapy [address] [place], [place], [address] Phone: [phone]   Fax:  [phone]  Physical Therapy Treatment  Patient Details  Name: Marie Vazquez MRN: [id number] Date of Birth: 03-21-1961 Referring Provider (PT): Dr. Jill Alexanders   Encounter Date: 07/07/2020   PT End of Session - 07/07/20 1514    Visit Number 3    Number of Visits 12    Date for PT Re-Evaluation 08/04/20    Progress Note Due on Visit 10    PT Start Time 1514    PT Stop Time 1552    PT Time Calculation (min) 38 min    Activity Tolerance Patient tolerated treatment well    Behavior During Therapy [facility] for tasks assessed/performed           Past Medical History:  Diagnosis Date  . Chronic tension headaches   . Contraception   . Fibroids   . GERD (gastroesophageal reflux disease)    "sometimes"  . Panic attacks   . Tubulovillous adenoma of colon with high grade dysplasia 06/22/2017   Repeat colonoscopy in 1 yr  . Vitamin D deficiency     Past Surgical History:  Procedure Laterality Date  . COLON SURGERY Right 08/16/2017   lap assisted right colectomy  . LAPAROSCOPIC RIGHT COLECTOMY Right 08/16/2017   Procedure: LAPAROSCOPIC ASSISTED  RIGHT COLECTOMY ERAS PATHWAY;  Surgeon: Georganna Skeans, MD;  Location: [place];  Service: General;  Laterality: Right;  . MYOMECTOMY    . TONSILLECTOMY      There were no vitals filed for this visit.   Subjective Assessment - 07/07/20 1518    Subjective Pt. indicated feeling improvement in initial complaints for visit.  PT. stated noticing the Rt lumbar related soren complaints c prolonged walking is still present.    Patient Stated Goals Reduce pain    Currently in Pain? No/denies    Pain Score 0-No pain    Pain Onset More than a month ago                             [facility] Adult PT Treatment/Exercise - 07/07/20 0001      Lumbar Exercises: Stretches   Single Knee to Chest Stretch 5 reps   5 x 15  seconds bilateral   Lower Trunk Rotation 5 reps;Other (comment)   5 x 15 seconds each     Lumbar Exercises: Aerobic   Recumbent Bike Lvl 3 10 mins      Lumbar Exercises: Supine   Other Supine Lumbar Exercises supine pelvic tilt post c marching 2 x 10, isometric hip flexion c ipsilateral hold 5 sec x 10 bilateral                    PT Short Term Goals - 07/07/20 1519      PT SHORT TERM GOAL #1   Title Patient will demonstrate independent use of home exercise program to maintain progress from in clinic treatments.    Time 2    Period Weeks    Status Achieved    Target Date 06/23/20             PT Long Term Goals - 06/25/20 0852      PT LONG TERM GOAL #1   Title Patient will demonstrate/report pain at worst less than or equal to 2/10 to facilitate minimal limitation in daily activity secondary to pain symptoms.    Time 8  Period Weeks    Status On-going    Target Date 08/04/20      PT LONG TERM GOAL #2   Title Patient will demonstrate independent use of home exercise program to facilitate ability to maintain/progress functional gains from skilled physical therapy services.    Time 8    Period Weeks    Status Partially Met    Target Date 08/04/20      PT LONG TERM GOAL #3   Title Pt. will demonstrate lumbar ext mobility 100 % WFL s postural deviations to facilitate full range, standing posture at PLOF s limitation.    Time 8    Period Weeks    Status On-going    Target Date 08/04/20      PT LONG TERM GOAL #4   Title Pt. will demonstrate/report ability to sleep unrestricted due to symptoms.    Time 8    Period Weeks    Status Achieved    Target Date 08/04/20      PT LONG TERM GOAL #5   Title Pt. will demonstrate bilateral hip extension 5/5 s pelvic tilt deviation for improved movmeent coordination in daily activity/mobility.    Time 8    Period Weeks    Status On-going    Target Date 08/04/20                 Plan - 07/07/20 1519     Clinical Impression Statement Positive reduction of initial chief complaint noted and documented.  Superior to original complaint, Rt mid lumbar region implicated in current symptom c walking.  Pt. to benefit from improve myofascial mobility in Rt lower flank as well as improved core endurance for ambulation activity    Personal Factors and Comorbidities Other   GERD, chronic tension headaches   Examination-Activity Limitations Sleep;Sit;Stand    Examination-Participation Restrictions Other   sleep, housework   Stability/Clinical Decision Making Stable/Uncomplicated    Rehab Potential Good    PT Frequency --   1-2x/week   PT Duration 8 weeks    PT Treatment/Interventions ADLs/Self Care Home Management;Electrical Stimulation;Cryotherapy;Iontophoresis 63m/ml Dexamethasone;Moist Heat;Traction;Balance training;Therapeutic exercise;Therapeutic activities;Functional mobility training;Gait training;Stair training;Ultrasound;Neuromuscular re-education;Patient/family education;Manual techniques;Taping;Dry needling;Passive range of motion;Spinal Manipulations;Joint Manipulations    PT Next Visit Plan lumbar manip, pelvic stability c LE movement.    PT Home Exercise Plan [id number]    Consulted and Agree with Plan of Care Patient           Patient will benefit from skilled therapeutic intervention in order to improve the following deficits and impairments:     Visit Diagnosis: Chronic right-sided low back pain without sciatica  Pain in right hip  Muscle weakness (generalized)  Abnormal posture     Problem List Patient Active Problem List   Diagnosis Date Noted  . Diverticulosis 11/14/2018  . S/P laparoscopic colectomy 08/16/2017  . Vitamin D deficiency 09/25/2011    MScot Jun PT, DPT, OCS, ATC 07/07/20  3:37 PM    [facility] [address] [place] [address]Phone: [phone]  Fax:  [phone] Name: SKiasha BellinMRN:  [id number] of Birth: [birth year]

## 2020-07-14 ENCOUNTER — Other Ambulatory Visit: Payer: Self-pay

## 2020-07-14 ENCOUNTER — Ambulatory Visit (INDEPENDENT_AMBULATORY_CARE_PROVIDER_SITE_OTHER): Payer: BC Managed Care – PPO | Admitting: Rehabilitative and Restorative Service Providers"

## 2020-07-14 ENCOUNTER — Encounter: Payer: Self-pay | Admitting: Rehabilitative and Restorative Service Providers"

## 2020-07-14 DIAGNOSIS — M6281 Muscle weakness (generalized): Secondary | ICD-10-CM

## 2020-07-14 DIAGNOSIS — M545 Low back pain, unspecified: Secondary | ICD-10-CM

## 2020-07-14 DIAGNOSIS — G8929 Other chronic pain: Secondary | ICD-10-CM

## 2020-07-14 DIAGNOSIS — M25551 Pain in right hip: Secondary | ICD-10-CM

## 2020-07-14 DIAGNOSIS — R293 Abnormal posture: Secondary | ICD-10-CM

## 2020-07-14 NOTE — Therapy (Signed)
[facility] Physical Therapy [address] [place], [place], [address] Phone: [phone]   Fax:  [phone]  Physical Therapy Treatment  Patient Details  Name: Lyzbeth Genrich MRN: [id number] Date of Birth: 15-Nov-1960 Referring Provider (PT): Dr. Jill Alexanders   Encounter Date: 07/14/2020   PT End of Session - 07/14/20 1515    Visit Number 4    Number of Visits 12    Date for PT Re-Evaluation 08/04/20    Progress Note Due on Visit 10    PT Start Time 1514    PT Stop Time 1554    PT Time Calculation (min) 40 min    Activity Tolerance Patient tolerated treatment well    Behavior During Therapy [facility] for tasks assessed/performed           Past Medical History:  Diagnosis Date  . Chronic tension headaches   . Contraception   . Fibroids   . GERD (gastroesophageal reflux disease)    "sometimes"  . Panic attacks   . Tubulovillous adenoma of colon with high grade dysplasia 06/22/2017   Repeat colonoscopy in 1 yr  . Vitamin D deficiency     Past Surgical History:  Procedure Laterality Date  . COLON SURGERY Right 08/16/2017   lap assisted right colectomy  . LAPAROSCOPIC RIGHT COLECTOMY Right 08/16/2017   Procedure: LAPAROSCOPIC ASSISTED  RIGHT COLECTOMY ERAS PATHWAY;  Surgeon: Georganna Skeans, MD;  Location: [place];  Service: General;  Laterality: Right;  . MYOMECTOMY    . TONSILLECTOMY      There were no vitals filed for this visit.                      [place] Adult PT Treatment/Exercise - 07/14/20 0001      Lumbar Exercises: Stretches   Single Knee to Chest Stretch 5 reps;Other (comment)   15 sec x 5 bilateral   Lower Trunk Rotation 5 reps;Other (comment)   15 sec x5 bilateral     Manual Therapy   Manual therapy comments compression to Rt QL, uPA g3 throughout lumbar            Trigger Point Dry Needling - 07/14/20 0001    Consent Given? Yes    Education Handout Provided Yes    Muscles Treated Back/Hip Quadratus lumborum   Rt    Quadratus Lumborum Response Twitch response elicited                PT Education - 07/14/20 1535    Education Details DN    Person(s) Educated Patient    Methods Explanation;Handout    Comprehension Verbalized understanding            PT Short Term Goals - 07/07/20 1519      PT SHORT TERM GOAL #1   Title Patient will demonstrate independent use of home exercise program to maintain progress from in clinic treatments.    Time 2    Period Weeks    Status Achieved    Target Date 06/23/20             PT Long Term Goals - 06/25/20 0852      PT LONG TERM GOAL #1   Title Patient will demonstrate/report pain at worst less than or equal to 2/10 to facilitate minimal limitation in daily activity secondary to pain symptoms.    Time 8    Period Weeks    Status On-going    Target Date 08/04/20  PT LONG TERM GOAL #2   Title Patient will demonstrate independent use of home exercise program to facilitate ability to maintain/progress functional gains from skilled physical therapy services.    Time 8    Period Weeks    Status Partially Met    Target Date 08/04/20      PT LONG TERM GOAL #3   Title Pt. will demonstrate lumbar ext mobility 100 % WFL s postural deviations to facilitate full range, standing posture at PLOF s limitation.    Time 8    Period Weeks    Status On-going    Target Date 08/04/20      PT LONG TERM GOAL #4   Title Pt. will demonstrate/report ability to sleep unrestricted due to symptoms.    Time 8    Period Weeks    Status Achieved    Target Date 08/04/20      PT LONG TERM GOAL #5   Title Pt. will demonstrate bilateral hip extension 5/5 s pelvic tilt deviation for improved movmeent coordination in daily activity/mobility.    Time 8    Period Weeks    Status On-going    Target Date 08/04/20                 Plan - 07/14/20 1534    Clinical Impression Statement Localized lumbar central symptoms noted from uPA L3, L4, L5.  Concordant  Rt lower lumbar/superior posterior hip on Rt evident from Rt QL.  Good overall tolerance to intervention today.    Personal Factors and Comorbidities Other   GERD, chronic tension headaches   Examination-Activity Limitations Sleep;Sit;Stand    Examination-Participation Restrictions Other   sleep, housework   Stability/Clinical Decision Making Stable/Uncomplicated    Rehab Potential Good    PT Frequency --   1-2x/week   PT Duration 8 weeks    PT Treatment/Interventions ADLs/Self Care Home Management;Electrical Stimulation;Cryotherapy;Iontophoresis 40m/ml Dexamethasone;Moist Heat;Traction;Balance training;Therapeutic exercise;Therapeutic activities;Functional mobility training;Gait training;Stair training;Ultrasound;Neuromuscular re-education;Patient/family education;Manual techniques;Taping;Dry needling;Passive range of motion;Spinal Manipulations;Joint Manipulations    PT Next Visit Plan Reassess DN, myofascial release techniques    PT Home Exercise Plan [id number]    Consulted and Agree with Plan of Care Patient           Patient will benefit from skilled therapeutic intervention in order to improve the following deficits and impairments:     Visit Diagnosis: Chronic right-sided low back pain without sciatica  Pain in right hip  Muscle weakness (generalized)  Abnormal posture     Problem List Patient Active Problem List   Diagnosis Date Noted  . Diverticulosis 11/14/2018  . S/P laparoscopic colectomy 08/16/2017  . Vitamin D deficiency 09/25/2011    MScot Jun PT, DPT, OCS, ATC 07/14/20  3:39 PM    [facility] [address] [address]Phone: [phone]  Fax:  [phone] Name: SAunika KirstenMRN: [id number] of Birth: [birth year]

## 2020-07-21 ENCOUNTER — Encounter: Payer: BC Managed Care – PPO | Admitting: Rehabilitative and Restorative Service Providers"

## 2020-07-28 ENCOUNTER — Ambulatory Visit (INDEPENDENT_AMBULATORY_CARE_PROVIDER_SITE_OTHER): Payer: BC Managed Care – PPO | Admitting: Physical Therapy

## 2020-07-28 ENCOUNTER — Other Ambulatory Visit: Payer: Self-pay

## 2020-07-28 ENCOUNTER — Encounter: Payer: BC Managed Care – PPO | Admitting: Rehabilitative and Restorative Service Providers"

## 2020-07-28 DIAGNOSIS — G8929 Other chronic pain: Secondary | ICD-10-CM

## 2020-07-28 DIAGNOSIS — R293 Abnormal posture: Secondary | ICD-10-CM

## 2020-07-28 DIAGNOSIS — M25551 Pain in right hip: Secondary | ICD-10-CM | POA: Diagnosis not present

## 2020-07-28 DIAGNOSIS — M545 Low back pain, unspecified: Secondary | ICD-10-CM

## 2020-07-28 DIAGNOSIS — M6281 Muscle weakness (generalized): Secondary | ICD-10-CM | POA: Diagnosis not present

## 2020-07-28 NOTE — Therapy (Addendum)
[facility] Physical Therapy [address] [place], [place], [address] Phone: [phone]   Fax:  [phone]  Physical Therapy Treatment/Discharge addendum PHYSICAL THERAPY DISCHARGE SUMMARY  Visits from Start of Care: 5  Current functional level related to goals / functional outcomes: See below   Remaining deficits: See below   Education / Equipment: HEP Plan: Patient agrees to discharge.  Patient goals were partially met. Patient is being discharged due to not returning since the last visit.  ?????   Elsie Ra, PT, DPT 10/04/20 11:39 AM     Patient Details  Name: Marie Vazquez MRN: [id number] Date of Birth: 04-29-1961 Referring Provider (PT): Dr. Jill Alexanders   Encounter Date: 07/28/2020   PT End of Session - 07/28/20 1632    Visit Number 5    Number of Visits 12    Date for PT Re-Evaluation 08/04/20    Progress Note Due on Visit 10    PT Start Time 1600    PT Stop Time 1640    PT Time Calculation (min) 40 min    Activity Tolerance Patient tolerated treatment well    Behavior During Therapy [facility] for tasks assessed/performed           Past Medical History:  Diagnosis Date  . Chronic tension headaches   . Contraception   . Fibroids   . GERD (gastroesophageal reflux disease)    "sometimes"  . Panic attacks   . Tubulovillous adenoma of colon with high grade dysplasia 06/22/2017   Repeat colonoscopy in 1 yr  . Vitamin D deficiency     Past Surgical History:  Procedure Laterality Date  . COLON SURGERY Right 08/16/2017   lap assisted right colectomy  . LAPAROSCOPIC RIGHT COLECTOMY Right 08/16/2017   Procedure: LAPAROSCOPIC ASSISTED  RIGHT COLECTOMY ERAS PATHWAY;  Surgeon: Georganna Skeans, MD;  Location: [place];  Service: General;  Laterality: Right;  . MYOMECTOMY    . TONSILLECTOMY      There were no vitals filed for this visit.   Subjective Assessment - 07/28/20 1613    Subjective relays about 1-2 pain upon arrival in her Rt  hip, pain is intermittent and only with certain movements.    Patient Stated Goals Reduce pain    Pain Onset More than a month ago            [facility] Adult PT Treatment/Exercise - 07/28/20 0001      Lumbar Exercises: Stretches   Single Knee to Chest Stretch 3 reps;20 seconds;Right;Left    Lower Trunk Rotation 5 reps;10 seconds    Figure 4 Stretch 2 reps;30 seconds    Figure 4 Stretch Limitations bilat      Lumbar Exercises: Aerobic   Recumbent Bike Lvl 3 10 mins      Lumbar Exercises: Supine   Clam Limitations green 2 sets of 10    Bridge Compliant;15 reps;5 seconds    Other Supine Lumbar Exercises supine pelvic tilt post c marching 2 x 10, isometric hip flexion c ipsilateral hip extension hold 3 sec x 10 bilateral      Manual Therapy   Manual therapy comments Rt leg LAD, A-P mobs all in supine                    PT Short Term Goals - 07/07/20 1519      PT SHORT TERM GOAL #1   Title Patient will demonstrate independent use of home exercise program to maintain progress from in clinic treatments.  Time 2    Period Weeks    Status Achieved    Target Date 06/23/20             PT Long Term Goals - 06/25/20 0852      PT LONG TERM GOAL #1   Title Patient will demonstrate/report pain at worst less than or equal to 2/10 to facilitate minimal limitation in daily activity secondary to pain symptoms.    Time 8    Period Weeks    Status On-going    Target Date 08/04/20      PT LONG TERM GOAL #2   Title Patient will demonstrate independent use of home exercise program to facilitate ability to maintain/progress functional gains from skilled physical therapy services.    Time 8    Period Weeks    Status Partially Met    Target Date 08/04/20      PT LONG TERM GOAL #3   Title Pt. will demonstrate lumbar ext mobility 100 % WFL s postural deviations to facilitate full range, standing posture at PLOF s limitation.    Time 8    Period Weeks    Status On-going     Target Date 08/04/20      PT LONG TERM GOAL #4   Title Pt. will demonstrate/report ability to sleep unrestricted due to symptoms.    Time 8    Period Weeks    Status Achieved    Target Date 08/04/20      PT LONG TERM GOAL #5   Title Pt. will demonstrate bilateral hip extension 5/5 s pelvic tilt deviation for improved movmeent coordination in daily activity/mobility.    Time 8    Period Weeks    Status On-going    Target Date 08/04/20                 Plan - 07/28/20 1644    Clinical Impression Statement She was only having about 1/10 pain today and notes no pain after session. She feels like today should be her last visit while she trials her HEP and pain managment at home. She will call PT back if she feels she needs [place] again.    Personal Factors and Comorbidities Other   GERD, chronic tension headaches   Examination-Activity Limitations Sleep;Sit;Stand    Examination-Participation Restrictions Other   sleep, housework   Stability/Clinical Decision Making Stable/Uncomplicated    Rehab Potential Good    PT Frequency --   1-2x/week   PT Duration 8 weeks    PT Treatment/Interventions ADLs/Self Care Home Management;Electrical Stimulation;Cryotherapy;Iontophoresis 26m/ml Dexamethasone;Moist Heat;Traction;Balance training;Therapeutic exercise;Therapeutic activities;Functional mobility training;Gait training;Stair training;Ultrasound;Neuromuscular re-education;Patient/family education;Manual techniques;Taping;Dry needling;Passive range of motion;Spinal Manipulations;Joint Manipulations    PT Next Visit Plan hold PT for HEP trial.    PT Home Exercise Plan [id number]    Consulted and Agree with Plan of Care Patient           Patient will benefit from skilled therapeutic intervention in order to improve the following deficits and impairments:     Visit Diagnosis: Chronic right-sided low back pain without sciatica  Pain in right hip  Muscle weakness (generalized)  Abnormal  posture     Problem List Patient Active Problem List   Diagnosis Date Noted  . Diverticulosis 11/14/2018  . S/P laparoscopic colectomy 08/16/2017  . Vitamin D deficiency 09/25/2011    BSilvestre Mesi10/13/2021, 4:46 PM  [facility] [address] [address]Phone: [phone]  Fax:  [phone]  Name: Shayleen Eppinger MRN: [id number] Date of Birth: 02-09-1961

## 2020-11-02 ENCOUNTER — Encounter: Payer: BC Managed Care – PPO | Admitting: Family Medicine

## 2020-11-08 DIAGNOSIS — J029 Acute pharyngitis, unspecified: Secondary | ICD-10-CM | POA: Diagnosis not present

## 2020-11-08 DIAGNOSIS — Z20822 Contact with and (suspected) exposure to covid-19: Secondary | ICD-10-CM | POA: Diagnosis not present

## 2020-12-31 ENCOUNTER — Ambulatory Visit (INDEPENDENT_AMBULATORY_CARE_PROVIDER_SITE_OTHER): Payer: BC Managed Care – PPO | Admitting: Family Medicine

## 2020-12-31 ENCOUNTER — Encounter: Payer: Self-pay | Admitting: Family Medicine

## 2020-12-31 ENCOUNTER — Other Ambulatory Visit: Payer: Self-pay

## 2020-12-31 VITALS — BP 118/78 | HR 69 | Temp 97.2°F | Ht 65.0 in | Wt 182.0 lb

## 2020-12-31 DIAGNOSIS — Z0000 Encounter for general adult medical examination without abnormal findings: Secondary | ICD-10-CM | POA: Diagnosis not present

## 2020-12-31 DIAGNOSIS — Z9049 Acquired absence of other specified parts of digestive tract: Secondary | ICD-10-CM | POA: Diagnosis not present

## 2020-12-31 DIAGNOSIS — E559 Vitamin D deficiency, unspecified: Secondary | ICD-10-CM | POA: Diagnosis not present

## 2020-12-31 DIAGNOSIS — D122 Benign neoplasm of ascending colon: Secondary | ICD-10-CM

## 2020-12-31 DIAGNOSIS — K5790 Diverticulosis of intestine, part unspecified, without perforation or abscess without bleeding: Secondary | ICD-10-CM

## 2020-12-31 NOTE — Progress Notes (Signed)
   Subjective:    Patient ID: Marie Vazquez, female    DOB: 1961/06/30, 60 y.o.   MRN: [id number]  HPI She is here for a complete examination.  She gets regular gynecologic care and is planning on going in there for Pap smear as well as mammogram.  She had a laparoscopic colectomy for removal of a high-grade colonic polyp.  She is followed by Dr. Watt Climes at [place].  She has no other concerns or complaints.  She is taking a multivitamin.  Does have a previous history of vitamin D deficiency.  She does not smoke or drink.  Her home life is going quite well.  Family and social history as well as health maintenance and immunizations was reviewed   Review of Systems  All other systems reviewed and are negative.      Objective:   Physical Exam Alert and in no distress. Tympanic membranes and canals are normal. Pharyngeal area is normal. Neck is supple without adenopathy or thyromegaly. Cardiac exam shows a regular sinus rhythm without murmurs or gallops. Lungs are clear to auscultation.  Abdominal exam shows no masses or tenderness with normal bowel sounds.       Assessment & Plan:  Routine general medical examination at a health care facility - Plan: CBC with Differential/Platelet, Comprehensive metabolic panel, Lipid panel  Vitamin D deficiency - Plan: VITAMIN D 25 Hydroxy (Vit-D Deficiency, Fractures)  S/P laparoscopic colectomy  Diverticulosis  Adenomatous polyp of ascending colon She will continue to follow by GI concerning her high-grade polyp.  Recommend she continue on her multivitamin and increase her physical activity.

## 2021-01-01 LAB — CBC WITH DIFFERENTIAL/PLATELET
Basophils Absolute: 0 10*3/uL (ref 0.0–0.2)
Basos: 1 %
EOS (ABSOLUTE): 0.1 10*3/uL (ref 0.0–0.4)
Eos: 1 %
Hematocrit: 39.7 % (ref 34.0–46.6)
Hemoglobin: 13.5 g/dL (ref 11.1–15.9)
Immature Grans (Abs): 0 10*3/uL (ref 0.0–0.1)
Immature Granulocytes: 0 %
Lymphocytes Absolute: 2 10*3/uL (ref 0.7–3.1)
Lymphs: 39 %
MCH: 31 pg (ref 26.6–33.0)
MCHC: 34 g/dL (ref 31.5–35.7)
MCV: 91 fL (ref 79–97)
Monocytes Absolute: 0.4 10*3/uL (ref 0.1–0.9)
Monocytes: 7 %
Neutrophils Absolute: 2.6 10*3/uL (ref 1.4–7.0)
Neutrophils: 52 %
Platelets: 166 10*3/uL (ref 150–450)
RBC: 4.35 x10E6/uL (ref 3.77–5.28)
RDW: 11.9 % (ref 11.7–15.4)
WBC: 5.1 10*3/uL (ref 3.4–10.8)

## 2021-01-01 LAB — LIPID PANEL
Chol/HDL Ratio: 2.7 ratio (ref 0.0–4.4)
Cholesterol, Total: 165 mg/dL (ref 100–199)
HDL: 61 mg/dL (ref >39)
LDL Chol Calc (NIH): 93 mg/dL (ref 0–99)
Triglycerides: 55 mg/dL (ref 0–149)
VLDL Cholesterol Cal: 11 mg/dL (ref 5–40)

## 2021-01-01 LAB — COMPREHENSIVE METABOLIC PANEL
ALT: 21 IU/L (ref 0–32)
AST: 22 IU/L (ref 0–40)
Albumin/Globulin Ratio: 1.7 (ref 1.2–2.2)
Albumin: 4.7 g/dL (ref 3.8–4.9)
Alkaline Phosphatase: 101 IU/L (ref 44–121)
BUN/Creatinine Ratio: 13 (ref 9–23)
BUN: 12 mg/dL (ref 6–24)
Bilirubin Total: 0.4 mg/dL (ref 0.0–1.2)
CO2: 23 mmol/L (ref 20–29)
Calcium: 9.3 mg/dL (ref 8.7–10.2)
Chloride: 104 mmol/L (ref 96–106)
Creatinine, Ser: 0.93 mg/dL (ref 0.57–1.00)
Globulin, Total: 2.8 g/dL (ref 1.5–4.5)
Glucose: 87 mg/dL (ref 65–99)
Potassium: 4.1 mmol/L (ref 3.5–5.2)
Sodium: 141 mmol/L (ref 134–144)
Total Protein: 7.5 g/dL (ref 6.0–8.5)
eGFR: 71 mL/min/{1.73_m2} (ref >59)

## 2021-01-01 LAB — VITAMIN D 25 HYDROXY (VIT D DEFICIENCY, FRACTURES): Vit D, 25-Hydroxy: 25.9 ng/mL — ABNORMAL LOW (ref 30.0–100.0)

## 2021-05-12 ENCOUNTER — Ambulatory Visit: Payer: BC Managed Care – PPO | Attending: Family

## 2021-05-12 ENCOUNTER — Other Ambulatory Visit: Payer: Self-pay

## 2021-05-12 DIAGNOSIS — Z23 Encounter for immunization: Secondary | ICD-10-CM

## 2021-05-12 NOTE — Progress Notes (Signed)
   Covid-19 Vaccination Clinic  Name:  Marie Vazquez    MRN: [id number] DOB: 1961-02-07  05/12/2021  Marie Vazquez was observed post Covid-19 immunization for 15 minutes without incident. She was provided with Vaccine Information Sheet and instruction to access the V-Safe system.   Marie Vazquez was instructed to call 911 with any severe reactions post vaccine: Difficulty breathing  Swelling of face and throat  A fast heartbeat  A bad rash all over body  Dizziness and weakness   Immunizations Administered     Name Date Dose VIS Date Route   Pfizer COVID-19 Vaccine 05/12/2021  4:30 PM 0.3 mL 08/04/2020 Intramuscular   Manufacturer: [place]   Lot: [id number]   NDC: [id number]

## 2021-07-04 ENCOUNTER — Other Ambulatory Visit: Payer: Self-pay

## 2021-07-04 ENCOUNTER — Ambulatory Visit (INDEPENDENT_AMBULATORY_CARE_PROVIDER_SITE_OTHER): Payer: BC Managed Care – PPO | Admitting: Family Medicine

## 2021-07-04 VITALS — BP 110/70 | HR 62 | Temp 97.2°F | Ht 66.0 in | Wt 179.0 lb

## 2021-07-04 DIAGNOSIS — Z23 Encounter for immunization: Secondary | ICD-10-CM | POA: Diagnosis not present

## 2021-07-04 DIAGNOSIS — Z9049 Acquired absence of other specified parts of digestive tract: Secondary | ICD-10-CM | POA: Diagnosis not present

## 2021-07-04 DIAGNOSIS — E559 Vitamin D deficiency, unspecified: Secondary | ICD-10-CM

## 2021-07-04 DIAGNOSIS — K5790 Diverticulosis of intestine, part unspecified, without perforation or abscess without bleeding: Secondary | ICD-10-CM | POA: Diagnosis not present

## 2021-07-04 DIAGNOSIS — D122 Benign neoplasm of ascending colon: Secondary | ICD-10-CM

## 2021-07-04 NOTE — Patient Instructions (Signed)
Use an over-the-counter cortisone cream sparingly twice per day for the next several weeks and see if that will make it go away.  Sometimes you need to continue to use it periodically to keep it under control

## 2021-07-04 NOTE — Progress Notes (Signed)
   Subjective:    Patient ID: Marie Vazquez, female    DOB: 28-Jun-1961, 60 y.o.   MRN: [id number]  HPI She is here for an interval evaluation.  She continues on 2000 international units of vitamin D daily.  Her last vitamin D level was in the insufficient range.  She is getting ready to see her gynecologist for Pap pelvic and mammogram.  She does complain some itching in her neck area.  She does have a follow-up on her adenomatous colonic polyp and did have a colectomy because of the size of the lesion.  She was rescoped in 2000 and apparently nothing was seen other than diverticulosis.   Review of Systems     Objective:   Physical Exam Alert and in no distress. Tympanic membranes and canals are normal. Pharyngeal area is normal. Neck is supple without adenopathy or thyromegaly. Cardiac exam shows a regular sinus rhythm without murmurs or gallops. Lungs are clear to auscultation.        Assessment & Plan:  Vitamin D deficiency - Plan: VITAMIN D 25 Hydroxy (Vit-D Deficiency, Fractures)  S/P laparoscopic colectomy  Diverticulosis  Adenomatous polyp of ascending colon  Need for shingles vaccine - Plan: Varicella-zoster vaccine IM  Need for influenza vaccination - Plan: Flu Vaccine QUAD 68moIM (Fluarix, Fluzone & Alfiuria Quad PF) She will continue to be followed by GI for her underlying adenomatous polyp.  Probably end up having her take more vitamin D avoid people blood work to come in.

## 2021-07-05 LAB — VITAMIN D 25 HYDROXY (VIT D DEFICIENCY, FRACTURES): Vit D, 25-Hydroxy: 37 ng/mL (ref 30.0–100.0)

## 2021-07-11 DIAGNOSIS — Z6829 Body mass index (BMI) 29.0-29.9, adult: Secondary | ICD-10-CM | POA: Diagnosis not present

## 2021-07-11 DIAGNOSIS — Z01411 Encounter for gynecological examination (general) (routine) with abnormal findings: Secondary | ICD-10-CM | POA: Diagnosis not present

## 2021-07-11 DIAGNOSIS — Z01419 Encounter for gynecological examination (general) (routine) without abnormal findings: Secondary | ICD-10-CM | POA: Diagnosis not present

## 2021-07-11 DIAGNOSIS — Z124 Encounter for screening for malignant neoplasm of cervix: Secondary | ICD-10-CM | POA: Diagnosis not present

## 2021-07-11 DIAGNOSIS — Z1231 Encounter for screening mammogram for malignant neoplasm of breast: Secondary | ICD-10-CM | POA: Diagnosis not present

## 2021-07-11 LAB — HM PAP SMEAR

## 2021-07-11 LAB — RESULTS CONSOLE HPV: CHL HPV: NEGATIVE

## 2021-07-11 LAB — HM MAMMOGRAPHY

## 2021-07-12 ENCOUNTER — Other Ambulatory Visit: Payer: Self-pay | Admitting: Obstetrics and Gynecology

## 2021-07-12 DIAGNOSIS — Z780 Asymptomatic menopausal state: Secondary | ICD-10-CM

## 2021-09-28 ENCOUNTER — Other Ambulatory Visit: Payer: Self-pay

## 2021-09-28 ENCOUNTER — Ambulatory Visit (INDEPENDENT_AMBULATORY_CARE_PROVIDER_SITE_OTHER): Payer: BC Managed Care – PPO | Admitting: Medical

## 2021-09-28 VITALS — BP 120/70 | HR 67 | Temp 97.5°F | Wt 179.8 lb

## 2021-09-28 DIAGNOSIS — N261 Atrophy of kidney (terminal): Secondary | ICD-10-CM | POA: Diagnosis not present

## 2021-09-28 DIAGNOSIS — M461 Sacroiliitis, not elsewhere classified: Secondary | ICD-10-CM | POA: Insufficient documentation

## 2021-09-28 DIAGNOSIS — M545 Low back pain, unspecified: Secondary | ICD-10-CM | POA: Diagnosis not present

## 2021-09-28 DIAGNOSIS — M25551 Pain in right hip: Secondary | ICD-10-CM | POA: Diagnosis not present

## 2021-09-28 DIAGNOSIS — G8929 Other chronic pain: Secondary | ICD-10-CM

## 2021-09-28 DIAGNOSIS — R935 Abnormal findings on diagnostic imaging of other abdominal regions, including retroperitoneum: Secondary | ICD-10-CM | POA: Insufficient documentation

## 2021-09-28 MED ORDER — TIZANIDINE HCL 4 MG PO TABS: 4.0000 mg | ORAL_TABLET | Freq: Every evening | ORAL | 0 refills | Status: DC | PRN

## 2021-09-28 MED ORDER — HYDROCODONE-ACETAMINOPHEN 5-325 MG PO TABS: 1.0000 | ORAL_TABLET | Freq: Two times a day (BID) | ORAL | 0 refills | Status: DC | PRN

## 2021-09-28 NOTE — Progress Notes (Signed)
Subjective:  Marie Vazquez is a 60 y.o. female who presents for Chief Complaint  Patient presents with   right hip pain    Right hip pain, sometimes having issues walking due to pain. Dr. Redmond School sent her to PT back in the summer and it helped but has now flared back up      She notes right hip pain for the past year.  Saw Dr. Redmond School 2021 for same, was referred to physical therapy.   Been doing therapy.  Wants xray.  Sometimes with walking has to stop due to pain in leg and hip. Lately pain is daily.   No recent fall, trauma or injury.   No swelling.  No fever, no weight loss.   Mainly hip and radiating down leg, but no knee or lower leg pain.  Feels sore in right buttock and low back.   Had  done 6 PT sessions last year.   No numbness tingling or weakness.  No other aggravating or relieving factors.    No other c/o.  The following portions of the patient's history were reviewed and updated as appropriate: allergies, current medications, past family history, past medical history, past social history, past surgical history and problem list.  ROS Otherwise as in subjective above  Objective: BP 120/70    Pulse 67    Temp (!) 97.5 F (36.4 C)    Wt 179 lb 12.8 oz (81.6 kg)    LMP 01/01/2016    BMI 29.02 kg/m   General appearance: alert, no distress, well developed, well nourished Back: Nontender, however limited range of motion due to pain in the low back but no swelling or scoliosis MSK: Legs nontender, mildly decreased range of motion of bilateral hip internal and external range of motion, otherwise normal range of motion of hips and legs, no swelling or deformity  DTRs and strength normal, she does note pain in her back with heel and toe walk but she is able to do this abdomen: +bs, soft, non tender, non distended, no masses, no hepatomegaly, no splenomegaly Pulses: 2+ radial pulses, 2+ pedal pulses, normal cap refill Ext: no edema   Assessment: Encounter Diagnoses  Name Primary?    Chronic right hip pain Yes   Chronic right-sided low back pain, unspecified whether sciatica present    Sacroiliitis (HCC)    Atrophic kidney    Abnormal CT of the abdomen      Plan: I reviewed lumbar spine x-rays from August 2021 that was fairly normal.  I reviewed CT abdomen and pelvis from 2018 and follow-up MRI abdomen.  There were no significant musculoskeletal findings but she did have an atrophic kidney and other lesions that were further evaluated on MRI.  We discussed the need to avoid kidney toxic chemicals, NSAIDs and dehydration given her atrophic kidney.  I did review labs that she had done March 2022 that were normal for kidney function.  We discussed the kidney finding.  Possible congenital atrophic kidney  Chronic low back pain, sacroiliitis.  We discussed that her exam and symptoms do not necessarily suggest a hip issue but more of a low back and radicular issue.  Trial of medications below separately on different days.  Discussed risk and benefits and proper use of medication.  Referral to orthopedics.  Matie was seen today for right hip pain.  Diagnoses and all orders for this visit:  Chronic right hip pain -     AMB referral to orthopedics  Chronic right-sided low back  pain, unspecified whether sciatica present -     AMB referral to orthopedics  Sacroiliitis ([place]) -     AMB referral to orthopedics  Atrophic kidney  Abnormal CT of the abdomen  Other orders -     tiZANidine (ZANAFLEX) 4 MG tablet; Take 1 tablet (4 mg total) by mouth at bedtime as needed for muscle spasms. -     HYDROcodone-acetaminophen (NORCO) 5-325 MG tablet; Take 1 tablet by mouth 3 times/day as needed-between meals & bedtime.   Follow up: pending ortho consult

## 2021-10-05 DIAGNOSIS — M545 Low back pain, unspecified: Secondary | ICD-10-CM | POA: Diagnosis not present

## 2021-10-24 DIAGNOSIS — M47816 Spondylosis without myelopathy or radiculopathy, lumbar region: Secondary | ICD-10-CM | POA: Diagnosis not present

## 2021-10-25 ENCOUNTER — Other Ambulatory Visit: Payer: Self-pay | Admitting: Medical

## 2021-11-09 DIAGNOSIS — M47816 Spondylosis without myelopathy or radiculopathy, lumbar region: Secondary | ICD-10-CM | POA: Diagnosis not present

## 2021-11-16 DIAGNOSIS — M47816 Spondylosis without myelopathy or radiculopathy, lumbar region: Secondary | ICD-10-CM | POA: Diagnosis not present

## 2021-11-23 DIAGNOSIS — M47816 Spondylosis without myelopathy or radiculopathy, lumbar region: Secondary | ICD-10-CM | POA: Diagnosis not present

## 2021-12-29 ENCOUNTER — Ambulatory Visit
Admission: RE | Admit: 2021-12-29 | Discharge: 2021-12-29 | Disposition: A | Discharge status: Home / Self Care | Payer: BC Managed Care – PPO | Source: Ambulatory Visit | Attending: Obstetrics and Gynecology | Admitting: Obstetrics and Gynecology

## 2021-12-29 DIAGNOSIS — Z780 Asymptomatic menopausal state: Secondary | ICD-10-CM | POA: Diagnosis not present

## 2021-12-30 ENCOUNTER — Encounter: Payer: Self-pay | Admitting: Family Medicine

## 2022-01-06 ENCOUNTER — Ambulatory Visit (INDEPENDENT_AMBULATORY_CARE_PROVIDER_SITE_OTHER): Payer: BC Managed Care – PPO | Admitting: Family Medicine

## 2022-01-06 ENCOUNTER — Other Ambulatory Visit: Payer: Self-pay

## 2022-01-06 ENCOUNTER — Encounter: Payer: Self-pay | Admitting: Family Medicine

## 2022-01-06 VITALS — BP 122/64 | HR 65 | Ht 65.25 in | Wt 179.0 lb

## 2022-01-06 DIAGNOSIS — Z0000 Encounter for general adult medical examination without abnormal findings: Secondary | ICD-10-CM

## 2022-01-06 DIAGNOSIS — Z23 Encounter for immunization: Secondary | ICD-10-CM | POA: Diagnosis not present

## 2022-01-06 DIAGNOSIS — Z9049 Acquired absence of other specified parts of digestive tract: Secondary | ICD-10-CM | POA: Diagnosis not present

## 2022-01-06 DIAGNOSIS — N261 Atrophy of kidney (terminal): Secondary | ICD-10-CM

## 2022-01-06 DIAGNOSIS — D122 Benign neoplasm of ascending colon: Secondary | ICD-10-CM

## 2022-01-06 NOTE — Progress Notes (Signed)
? ?  Subjective:  ? ? Patient ID: Marie Vazquez, female    DOB: [birth year], 61 y.o.   MRN: [id number] ? ?HPI ?He is here for complete examination.  She has no particular concerns or complaints.  She does have a previous history of vitamin D deficiency and is taking a supplement.  She also scheduled for follow-up colonoscopy this year but has not been contacted by her gastroenterologist.  Previous x-rays did show evidence of an atrophic kidney.  She does see her gynecologist regularly and has had a recent DEXA scan which was normal. ?Family and social history as well as health maintenance and immunizations was reviewed. ? ?Review of Systems  ?All other systems reviewed and are negative. ? ?   ?Objective:  ? Physical Exam ?Alert and in no distress. Tympanic membranes and canals are normal. Pharyngeal area is normal. Neck is supple without adenopathy or thyromegaly. Cardiac exam shows a regular sinus rhythm without murmurs or gallops. Lungs are clear to auscultation. ? ? ? ? ?   ?Assessment & Plan:  ?Routine general medical examination at a health care facility ? ?S/P laparoscopic colectomy ? ?Adenomatous polyp of ascending colon ? ?Need for shingles vaccine - Plan: Varicella-zoster vaccine IM ? ?Atrophic kidney ?She will contact her gastroenterologist concerning getting the colonoscopy done.  Discussed the atrophic kidney with her in regard to minimizing use of an NSAID. ? ?

## 2022-03-24 ENCOUNTER — Encounter: Payer: Self-pay | Admitting: Family Medicine

## 2022-05-03 DIAGNOSIS — H16001 Unspecified corneal ulcer, right eye: Secondary | ICD-10-CM | POA: Diagnosis not present

## 2022-05-08 DIAGNOSIS — U071 COVID-19: Secondary | ICD-10-CM | POA: Diagnosis not present

## 2022-06-09 DIAGNOSIS — Z86010 Personal history of colonic polyps: Secondary | ICD-10-CM | POA: Diagnosis not present

## 2022-06-09 DIAGNOSIS — Z980 Intestinal bypass and anastomosis status: Secondary | ICD-10-CM | POA: Diagnosis not present

## 2022-06-09 DIAGNOSIS — K5730 Diverticulosis of large intestine without perforation or abscess without bleeding: Secondary | ICD-10-CM | POA: Diagnosis not present

## 2022-06-09 LAB — HM COLONOSCOPY

## 2022-06-21 ENCOUNTER — Encounter: Payer: Self-pay | Admitting: Internal Medicine

## 2022-06-22 ENCOUNTER — Encounter: Payer: Self-pay | Admitting: Family Medicine

## 2022-07-25 ENCOUNTER — Encounter: Payer: Self-pay | Admitting: Internal Medicine

## 2022-08-07 ENCOUNTER — Encounter: Payer: Self-pay | Admitting: Internal Medicine

## 2022-10-17 DIAGNOSIS — Z6827 Body mass index (BMI) 27.0-27.9, adult: Secondary | ICD-10-CM | POA: Diagnosis not present

## 2022-10-17 DIAGNOSIS — Z01419 Encounter for gynecological examination (general) (routine) without abnormal findings: Secondary | ICD-10-CM | POA: Diagnosis not present

## 2022-10-17 DIAGNOSIS — Z1231 Encounter for screening mammogram for malignant neoplasm of breast: Secondary | ICD-10-CM | POA: Diagnosis not present

## 2023-01-11 ENCOUNTER — Ambulatory Visit (INDEPENDENT_AMBULATORY_CARE_PROVIDER_SITE_OTHER): Payer: BC Managed Care – PPO | Admitting: Family Medicine

## 2023-01-11 VITALS — BP 120/78 | HR 64 | Ht 65.0 in | Wt 166.4 lb

## 2023-01-11 DIAGNOSIS — D122 Benign neoplasm of ascending colon: Secondary | ICD-10-CM

## 2023-01-11 DIAGNOSIS — Z9049 Acquired absence of other specified parts of digestive tract: Secondary | ICD-10-CM | POA: Diagnosis not present

## 2023-01-11 DIAGNOSIS — Z0000 Encounter for general adult medical examination without abnormal findings: Secondary | ICD-10-CM | POA: Diagnosis not present

## 2023-01-11 DIAGNOSIS — E559 Vitamin D deficiency, unspecified: Secondary | ICD-10-CM

## 2023-01-11 NOTE — Progress Notes (Signed)
   Subjective:    Patient ID: Marie Vazquez, female    DOB: Dec 12, 1960, 62 y.o.   MRN: [id number]  HPI She is here for a complete examination.  She has no particular concerns or complaints other than continued difficulty with back and hip discomfort.  She has been through physical therapy for this but has not continued with that care.  She also has a vitamin D deficiency in the past but has been taking multivitamin with iron.  She does have a history of colonic polyps and has already had a colonoscopy for follow-up on the colectomy that she had from before which revolved around the polyp that was too technically difficult to remove with the scope.  Work and home life are going well.  Otherwise her family and social history as well as health maintenance and immunizations was reviewed.  She sees her gynecologist regularly and is up to date on her immunizations and health maintenance.   Review of Systems  All other systems reviewed and are negative.      Objective:   Physical Exam Alert and in no distress. Tympanic membranes and canals are normal. Pharyngeal area is normal. Neck is supple without adenopathy or thyromegaly. Cardiac exam shows a regular sinus rhythm without murmurs or gallops. Lungs are clear to auscultation.        Assessment & Plan:  Routine general medical examination at a health care facility - Plan: CBC with Differential/Platelet, Comprehensive metabolic panel, Lipid panel  Adenomatous polyp of ascending colon  Vitamin D deficiency - Plan: VITAMIN D 25 Hydroxy (Vit-D Deficiency, Fractures)  S/P laparoscopic colectomy

## 2023-01-12 LAB — COMPREHENSIVE METABOLIC PANEL
ALT: 20 IU/L (ref 0–32)
AST: 19 IU/L (ref 0–40)
Albumin/Globulin Ratio: 1.6 (ref 1.2–2.2)
Albumin: 4.6 g/dL (ref 3.9–4.9)
Alkaline Phosphatase: 96 IU/L (ref 44–121)
BUN/Creatinine Ratio: 12 (ref 12–28)
BUN: 11 mg/dL (ref 8–27)
Bilirubin Total: 0.5 mg/dL (ref 0.0–1.2)
CO2: 26 mmol/L (ref 20–29)
Calcium: 9.8 mg/dL (ref 8.7–10.3)
Chloride: 104 mmol/L (ref 96–106)
Creatinine, Ser: 0.91 mg/dL (ref 0.57–1.00)
Globulin, Total: 2.8 g/dL (ref 1.5–4.5)
Glucose: 82 mg/dL (ref 70–99)
Potassium: 4.6 mmol/L (ref 3.5–5.2)
Sodium: 142 mmol/L (ref 134–144)
Total Protein: 7.4 g/dL (ref 6.0–8.5)
eGFR: 72 mL/min/{1.73_m2} (ref >59)

## 2023-01-12 LAB — CBC WITH DIFFERENTIAL/PLATELET
Basophils Absolute: 0 10*3/uL (ref 0.0–0.2)
Basos: 1 %
EOS (ABSOLUTE): 0 10*3/uL (ref 0.0–0.4)
Eos: 1 %
Hematocrit: 40.2 % (ref 34.0–46.6)
Hemoglobin: 13.5 g/dL (ref 11.1–15.9)
Immature Grans (Abs): 0 10*3/uL (ref 0.0–0.1)
Immature Granulocytes: 0 %
Lymphocytes Absolute: 1.7 10*3/uL (ref 0.7–3.1)
Lymphs: 42 %
MCH: 30.5 pg (ref 26.6–33.0)
MCHC: 33.6 g/dL (ref 31.5–35.7)
MCV: 91 fL (ref 79–97)
Monocytes Absolute: 0.3 10*3/uL (ref 0.1–0.9)
Monocytes: 7 %
Neutrophils Absolute: 2 10*3/uL (ref 1.4–7.0)
Neutrophils: 49 %
Platelets: 166 10*3/uL (ref 150–450)
RBC: 4.43 x10E6/uL (ref 3.77–5.28)
RDW: 11.4 % — ABNORMAL LOW (ref 11.7–15.4)
WBC: 4 10*3/uL (ref 3.4–10.8)

## 2023-01-12 LAB — LIPID PANEL
Chol/HDL Ratio: 2.4 ratio (ref 0.0–4.4)
Cholesterol, Total: 177 mg/dL (ref 100–199)
HDL: 74 mg/dL (ref >39)
LDL Chol Calc (NIH): 93 mg/dL (ref 0–99)
Triglycerides: 47 mg/dL (ref 0–149)
VLDL Cholesterol Cal: 10 mg/dL (ref 5–40)

## 2023-01-12 LAB — VITAMIN D 25 HYDROXY (VIT D DEFICIENCY, FRACTURES): Vit D, 25-Hydroxy: 37.4 ng/mL (ref 30.0–100.0)

## 2023-02-04 DIAGNOSIS — R519 Headache, unspecified: Secondary | ICD-10-CM | POA: Diagnosis not present

## 2023-02-04 DIAGNOSIS — M791 Myalgia, unspecified site: Secondary | ICD-10-CM | POA: Diagnosis not present

## 2023-02-04 DIAGNOSIS — R051 Acute cough: Secondary | ICD-10-CM | POA: Diagnosis not present

## 2023-02-04 DIAGNOSIS — R0981 Nasal congestion: Secondary | ICD-10-CM | POA: Diagnosis not present

## 2023-02-04 DIAGNOSIS — J209 Acute bronchitis, unspecified: Secondary | ICD-10-CM | POA: Diagnosis not present

## 2023-05-08 DIAGNOSIS — H04123 Dry eye syndrome of bilateral lacrimal glands: Secondary | ICD-10-CM | POA: Diagnosis not present

## 2023-05-08 DIAGNOSIS — H43811 Vitreous degeneration, right eye: Secondary | ICD-10-CM | POA: Diagnosis not present

## 2023-05-08 DIAGNOSIS — H40013 Open angle with borderline findings, low risk, bilateral: Secondary | ICD-10-CM | POA: Diagnosis not present

## 2023-05-08 DIAGNOSIS — H35371 Puckering of macula, right eye: Secondary | ICD-10-CM | POA: Diagnosis not present

## 2023-05-08 DIAGNOSIS — H524 Presbyopia: Secondary | ICD-10-CM | POA: Diagnosis not present

## 2023-07-24 ENCOUNTER — Encounter: Payer: Self-pay | Admitting: Family Medicine

## 2023-07-24 ENCOUNTER — Ambulatory Visit (INDEPENDENT_AMBULATORY_CARE_PROVIDER_SITE_OTHER): Payer: BC Managed Care – PPO | Admitting: Family Medicine

## 2023-07-24 VITALS — BP 120/70 | HR 71 | Ht 66.0 in | Wt 163.6 lb

## 2023-07-24 DIAGNOSIS — G8929 Other chronic pain: Secondary | ICD-10-CM

## 2023-07-24 DIAGNOSIS — Z23 Encounter for immunization: Secondary | ICD-10-CM

## 2023-07-24 DIAGNOSIS — N261 Atrophy of kidney (terminal): Secondary | ICD-10-CM

## 2023-07-24 DIAGNOSIS — D122 Benign neoplasm of ascending colon: Secondary | ICD-10-CM

## 2023-07-24 NOTE — Progress Notes (Signed)
Subjective:    Patient ID: Marie Vazquez, female    DOB: 1960-11-21, 62 y.o.   MRN: [id number]  HPI She was originally scheduled for an interval evaluation however she was seen in March and had no major issues.  Today she has no particular concerns but would like to get the flu shot.   Review of Systems     Objective:    Physical Exam Alert and in no distress otherwise not examined       Assessment & Plan:   Problem List Items Addressed This Visit   None Visit Diagnoses     Need for influenza vaccination    -  Primary   Relevant Orders   Flu vaccine trivalent PF, 6mos and older(Flulaval,Afluria,Fluarix,Fluzone) (Completed)     Recommend that she get set up for complete examination in March of next year.

## 2023-12-11 ENCOUNTER — Encounter: Payer: Self-pay | Admitting: Internal Medicine

## 2024-01-23 ENCOUNTER — Ambulatory Visit: Payer: BC Managed Care – PPO | Admitting: Family Medicine

## 2024-01-23 ENCOUNTER — Encounter: Payer: Self-pay | Admitting: Family Medicine

## 2024-01-23 VITALS — BP 120/80 | HR 58 | Ht 66.0 in | Wt 167.8 lb

## 2024-01-23 DIAGNOSIS — Z9049 Acquired absence of other specified parts of digestive tract: Secondary | ICD-10-CM

## 2024-01-23 DIAGNOSIS — Z0000 Encounter for general adult medical examination without abnormal findings: Secondary | ICD-10-CM | POA: Diagnosis not present

## 2024-01-23 DIAGNOSIS — Z23 Encounter for immunization: Secondary | ICD-10-CM

## 2024-01-23 DIAGNOSIS — D122 Benign neoplasm of ascending colon: Secondary | ICD-10-CM | POA: Diagnosis not present

## 2024-01-23 DIAGNOSIS — Z13220 Encounter for screening for lipoid disorders: Secondary | ICD-10-CM

## 2024-01-23 DIAGNOSIS — G8929 Other chronic pain: Secondary | ICD-10-CM

## 2024-01-23 DIAGNOSIS — M545 Low back pain, unspecified: Secondary | ICD-10-CM | POA: Diagnosis not present

## 2024-01-23 LAB — LIPID PANEL

## 2024-01-23 NOTE — Progress Notes (Signed)
 Complete physical exam  Patient: Marie Vazquez   DOB: 02-18-1961   63 y.o. Female  MRN: [id number]  Subjective:    Chief Complaint  Patient presents with   Annual Exam    Physical. CPE. Fasting.     Takeysha Bonk is a 63 y.o. female who presents today for a complete physical exam.  She reports consuming a general diet.  Does not excerise  She generally feels well. She reports sleeping fairly well.  She is on no prescription medications and does take Multivite.  She has had some hip pain but states that physical therapy has helped alleviate that and plans to continue with that.  She does have a history of colonic polyp and subsequent surgery for that.  She is scheduled for follow-up colonoscopy within the next several years.  Her work, home life as well as family and social history is all unchanged.  She plans to retire in a few more years.  Most recent fall risk assessment:    01/23/2024    8:13 AM  Fall Risk   Falls in the past year? 0  Number falls in past yr: 0  Injury with Fall? 0  Risk for fall due to : No Fall Risks  Follow up Falls evaluation completed     Most recent depression screenings:    01/23/2024    8:14 AM 01/11/2023    8:13 AM  PHQ 2/9 Scores  PHQ - 2 Score 0 0    Vision:a little over a year and Dental: No current dental problems and Last dental visit: a couple of months ago    Immunization History  Administered Date(s) Administered   Influenza, Seasonal, Injecte, Preservative Fre 07/24/2023   Influenza,inj,Quad PF,6+ Mos 06/07/2020, 07/04/2021   PFIZER(Purple Top)SARS-COV-2 Vaccination 12/29/2019, 01/20/2020, 09/05/2020, 05/12/2021   PNEUMOCOCCAL CONJUGATE-20 01/23/2024   PPD Test 04/11/1991   Td 09/23/2007   Tdap 09/15/2009, 03/01/2020   Zoster Recombinant(Shingrix) 07/04/2021, 01/06/2022    Health Maintenance  Topic Date Due   HIV Screening  Never done   COVID-19 Vaccine (5 - 2024-25 season) 06/17/2023   INFLUENZA VACCINE  05/16/2024    MAMMOGRAM  10/17/2024   Colonoscopy  06/09/2025   Cervical Cancer Screening (HPV/Pap Cotest)  07/11/2026   DTaP/Tdap/Td (4 - Td or Tdap) 03/01/2030   Hepatitis C Screening  Completed   Zoster Vaccines- Shingrix  Completed   Pneumococcal Vaccine 65-22 Years old  Aged Out   HPV VACCINES  Aged Out    Patient Care Team: Ronnald Nian, MD as PCP - General   Outpatient Medications Prior to Visit  Medication Sig   Cholecalciferol (VITAMIN D3 PO) Take 2,000 Units by mouth.   Multiple Vitamins-Minerals (HAIR SKIN AND NAILS FORMULA PO) Take by mouth.   No facility-administered medications prior to visit.    Review of Systems  All other systems reviewed and are negative.   Family and social history as well as health maintenance and immunizations was reviewed.     Objective:    BP 120/80   Pulse (!) 58   Ht 5\' 6"  (1.676 m)   Wt 167 lb 12.8 oz (76.1 kg)   LMP 01/01/2016   SpO2 98%   BMI 27.08 kg/m    Physical Exam  Alert and in no distress. Tympanic membranes and canals are normal. Pharyngeal area is normal. Neck is supple without adenopathy or thyromegaly. Cardiac exam shows a regular sinus rhythm without murmurs or gallops. Lungs are clear to auscultation.  Assessment & Plan:    Discussed health benefits of physical activity, and encouraged her to engage in regular exercise appropriate for her age and condition.  Routine general medical examination at a health care facility  Chronic right-sided low back pain without sciatica  Adenomatous polyp of ascending colon - Plan: CBC with Differential/Platelet, Comprehensive metabolic panel with GFR  S/P laparoscopic colectomy  Need for vaccination against Streptococcus pneumoniae - Plan: Pneumococcal conjugate vaccine 20-valent (Prevnar 20)  Screening for lipid disorders - Plan: Lipid panel   Return in about 1 year (around 01/22/2025).      Sharlot Gowda, MD

## 2024-01-24 ENCOUNTER — Encounter: Payer: Self-pay | Admitting: Family Medicine

## 2024-01-24 LAB — CBC WITH DIFFERENTIAL/PLATELET
Basophils Absolute: 0 10*3/uL (ref 0.0–0.2)
Basos: 1 %
EOS (ABSOLUTE): 0 10*3/uL (ref 0.0–0.4)
Eos: 1 %
Hematocrit: 41.7 % (ref 34.0–46.6)
Hemoglobin: 14.1 g/dL (ref 11.1–15.9)
Immature Grans (Abs): 0 10*3/uL (ref 0.0–0.1)
Immature Granulocytes: 0 %
Lymphocytes Absolute: 1.7 10*3/uL (ref 0.7–3.1)
Lymphs: 42 %
MCH: 31.5 pg (ref 26.6–33.0)
MCHC: 33.8 g/dL (ref 31.5–35.7)
MCV: 93 fL (ref 79–97)
Monocytes Absolute: 0.3 10*3/uL (ref 0.1–0.9)
Monocytes: 7 %
Neutrophils Absolute: 2 10*3/uL (ref 1.4–7.0)
Neutrophils: 49 %
Platelets: 158 10*3/uL (ref 150–450)
RBC: 4.48 x10E6/uL (ref 3.77–5.28)
RDW: 11.8 % (ref 11.7–15.4)
WBC: 4 10*3/uL (ref 3.4–10.8)

## 2024-01-24 LAB — LIPID PANEL
Cholesterol, Total: 173 mg/dL (ref 100–199)
HDL: 70 mg/dL (ref >39)
LDL CALC COMMENT:: 2.5 ratio (ref 0.0–4.4)
LDL Chol Calc (NIH): 91 mg/dL (ref 0–99)
Triglycerides: 60 mg/dL (ref 0–149)
VLDL Cholesterol Cal: 12 mg/dL (ref 5–40)

## 2024-01-24 LAB — COMPREHENSIVE METABOLIC PANEL WITH GFR
ALT: 17 IU/L (ref 0–32)
AST: 17 IU/L (ref 0–40)
Albumin: 4.8 g/dL (ref 3.9–4.9)
Alkaline Phosphatase: 93 IU/L (ref 44–121)
BUN/Creatinine Ratio: 13 (ref 12–28)
BUN: 12 mg/dL (ref 8–27)
Bilirubin Total: 0.4 mg/dL (ref 0.0–1.2)
CO2: 24 mmol/L (ref 20–29)
Calcium: 9.7 mg/dL (ref 8.7–10.3)
Chloride: 105 mmol/L (ref 96–106)
Creatinine, Ser: 0.9 mg/dL (ref 0.57–1.00)
Globulin, Total: 2.8 g/dL (ref 1.5–4.5)
Glucose: 86 mg/dL (ref 70–99)
Potassium: 4.3 mmol/L (ref 3.5–5.2)
Sodium: 143 mmol/L (ref 134–144)
Total Protein: 7.6 g/dL (ref 6.0–8.5)
eGFR: 72 mL/min/{1.73_m2} (ref >59)

## 2024-01-31 LAB — HM MAMMOGRAPHY

## 2024-05-08 ENCOUNTER — Encounter: Payer: Self-pay | Admitting: Family Medicine

## 2025-01-28 ENCOUNTER — Encounter: Payer: Self-pay | Admitting: Family Medicine
